# Patient Record
Sex: Female | Born: 1980
Health system: Southern US, Community
[De-identification: ages and names within clinical notes are randomized; demographics above are authoritative.]

## PROBLEM LIST (undated history)

## (undated) DIAGNOSIS — O24419 Gestational diabetes mellitus in pregnancy, unspecified control: Secondary | ICD-10-CM

## (undated) DIAGNOSIS — O1490 Unspecified pre-eclampsia, unspecified trimester: Secondary | ICD-10-CM

## (undated) DIAGNOSIS — N189 Chronic kidney disease, unspecified: Secondary | ICD-10-CM

## (undated) DIAGNOSIS — Z9889 Other specified postprocedural states: Secondary | ICD-10-CM

## (undated) DIAGNOSIS — N390 Urinary tract infection, site not specified: Secondary | ICD-10-CM

## (undated) DIAGNOSIS — F99 Mental disorder, not otherwise specified: Secondary | ICD-10-CM

## (undated) DIAGNOSIS — K219 Gastro-esophageal reflux disease without esophagitis: Secondary | ICD-10-CM

## (undated) DIAGNOSIS — R112 Nausea with vomiting, unspecified: Secondary | ICD-10-CM

## (undated) DIAGNOSIS — Z87442 Personal history of urinary calculi: Secondary | ICD-10-CM

## (undated) HISTORY — DX: Mental disorder, not otherwise specified: F99

## (undated) HISTORY — DX: Chronic kidney disease, unspecified: N18.9

## (undated) HISTORY — DX: Unspecified pre-eclampsia, unspecified trimester: O14.90

## (undated) HISTORY — DX: Gestational diabetes mellitus in pregnancy, unspecified control: O24.419

---

## 2001-10-28 ENCOUNTER — Emergency Department (HOSPITAL_COMMUNITY): Admission: EM | Admit: 2001-10-28 | Discharge: 2001-10-28 | Payer: Self-pay | Admitting: Emergency Medicine

## 2012-03-29 ENCOUNTER — Emergency Department (HOSPITAL_COMMUNITY): Payer: Self-pay

## 2012-03-29 ENCOUNTER — Emergency Department (HOSPITAL_COMMUNITY)
Admission: EM | Admit: 2012-03-29 | Discharge: 2012-03-29 | Disposition: A | Discharge status: Home / Self Care | Payer: Self-pay | Attending: Emergency Medicine | Admitting: Emergency Medicine

## 2012-03-29 ENCOUNTER — Encounter (HOSPITAL_COMMUNITY): Payer: Self-pay

## 2012-03-29 DIAGNOSIS — N390 Urinary tract infection, site not specified: Secondary | ICD-10-CM | POA: Insufficient documentation

## 2012-03-29 DIAGNOSIS — R10819 Abdominal tenderness, unspecified site: Secondary | ICD-10-CM | POA: Insufficient documentation

## 2012-03-29 DIAGNOSIS — R109 Unspecified abdominal pain: Secondary | ICD-10-CM | POA: Insufficient documentation

## 2012-03-29 HISTORY — DX: Urinary tract infection, site not specified: N39.0

## 2012-03-29 LAB — COMPREHENSIVE METABOLIC PANEL
AST: 17 U/L (ref 0–37)
Albumin: 2.9 g/dL — ABNORMAL LOW (ref 3.5–5.2)
BUN: 10 mg/dL (ref 6–23)
Chloride: 99 mEq/L (ref 96–112)
Creatinine, Ser: 0.77 mg/dL (ref 0.50–1.10)
Potassium: 4.3 mEq/L (ref 3.5–5.1)
Total Protein: 6.7 g/dL (ref 6.0–8.3)

## 2012-03-29 LAB — CBC WITH DIFFERENTIAL/PLATELET
Basophils Absolute: 0 10*3/uL (ref 0.0–0.1)
Basophils Relative: 0 % (ref 0–1)
Eosinophils Absolute: 0.4 10*3/uL (ref 0.0–0.7)
Hemoglobin: 14.5 g/dL (ref 12.0–15.0)
MCH: 30.3 pg (ref 26.0–34.0)
MCHC: 34.4 g/dL (ref 30.0–36.0)
Monocytes Absolute: 1.2 10*3/uL — ABNORMAL HIGH (ref 0.1–1.0)
Monocytes Relative: 5 % (ref 3–12)
Neutro Abs: 16.5 10*3/uL — ABNORMAL HIGH (ref 1.7–7.7)
Neutrophils Relative %: 75 % (ref 43–77)
RDW: 13 % (ref 11.5–15.5)

## 2012-03-29 LAB — URINALYSIS, ROUTINE W REFLEX MICROSCOPIC
Glucose, UA: NEGATIVE mg/dL
Leukocytes, UA: NEGATIVE
pH: 7 (ref 5.0–8.0)

## 2012-03-29 LAB — PREGNANCY, URINE: Preg Test, Ur: NEGATIVE

## 2012-03-29 LAB — URINE MICROSCOPIC-ADD ON

## 2012-03-29 MED ORDER — CEPHALEXIN 500 MG PO CAPS: 500.0000 mg | ORAL_CAPSULE | Freq: Four times a day (QID) | ORAL | Status: DC

## 2012-03-29 MED ORDER — HYDROMORPHONE HCL PF 1 MG/ML IJ SOLN
1.0000 mg | Freq: Once | INTRAMUSCULAR | Status: AC
  Administered 2012-03-29: 1 mg via INTRAVENOUS
  Filled 2012-03-29: qty 1

## 2012-03-29 MED ORDER — ONDANSETRON HCL 4 MG PO TABS: 4.0000 mg | ORAL_TABLET | Freq: Four times a day (QID) | ORAL | Status: DC

## 2012-03-29 MED ORDER — ONDANSETRON HCL 4 MG/2ML IJ SOLN
4.0000 mg | Freq: Once | INTRAMUSCULAR | Status: AC
  Administered 2012-03-29: 4 mg via INTRAVENOUS
  Filled 2012-03-29: qty 2

## 2012-03-29 MED ORDER — DEXTROSE 5 % IV SOLN
1.0000 g | Freq: Once | INTRAVENOUS | Status: AC
  Administered 2012-03-29: 1 g via INTRAVENOUS
  Filled 2012-03-29: qty 10

## 2012-03-29 MED ORDER — HYDROCODONE-ACETAMINOPHEN 5-325 MG PO TABS: 1.0000 | ORAL_TABLET | Freq: Four times a day (QID) | ORAL | Status: DC | PRN

## 2012-03-29 NOTE — ED Notes (Signed)
Pt reports left flank pain for 1 week, was seen at urgent care this am and has results, told to come to er for ct scan, +n/v, +fever. Pain is constant, started in lower back and is now into left lower quad area.

## 2012-03-29 NOTE — ED Notes (Signed)
Gave pt's husband her prescriptions so he could go ahead and get them filled.

## 2012-03-29 NOTE — ED Provider Notes (Signed)
History  This chart was scribed for Sherry Lennert, MD by Erskine Emery. This patient was seen in room APA14/APA14 and the patient's care was started at 10:49.   CSN: [id number]  Arrival date & time 03/29/12  1007   First MD Initiated Contact with Patient 03/29/12 1049      Chief Complaint  Patient presents with  . Flank Pain    (Consider location/radiation/quality/duration/timing/severity/associated sxs/prior Treatment) Sherry Flores is a 31 y.o. female who presents to the Emergency Department complaining of right flank pain, abdominal pain, nausea, and cold sweats for the past week. Pt reports a h/o 2 caesarian sections. Pt was seen last week for a similar complaint, located more in the back, and was treated for muscular issues with no urinalysis. Pt went to urgent care this morning; they did a urinalysis but did not give her a pregnancy test then sent her here to have an abdominal CT.  Patient is a 31 y.o. female presenting with flank pain. The history is provided by the patient. No language interpreter was used.  Flank Pain This is a new problem. The current episode started more than 2 days ago. The problem occurs constantly. The problem has not changed since onset.Associated symptoms include abdominal pain. Pertinent negatives include no chest pain and no headaches. Nothing aggravates the symptoms.    Past Medical History  Diagnosis Date  . UTI (lower urinary tract infection)     Past Surgical History  Procedure Date  . Cesarean section     No family history on file.  History  Substance Use Topics  . Smoking status: Never Smoker   . Smokeless tobacco: Not on file  . Alcohol Use: No    OB History    Grav Para Term Preterm Abortions TAB SAB Ect Mult Living                  Review of Systems  Constitutional: Positive for diaphoresis. Negative for fatigue.  HENT: Negative for congestion, sinus pressure and ear discharge.   Eyes: Negative for discharge.    Respiratory: Negative for cough.   Cardiovascular: Negative for chest pain.  Gastrointestinal: Positive for nausea and abdominal pain. Negative for diarrhea.  Genitourinary: Positive for flank pain. Negative for frequency and hematuria.  Musculoskeletal: Positive for back pain.  Skin: Negative for rash.  Neurological: Negative for seizures and headaches.  Hematological: Negative.   Psychiatric/Behavioral: Negative for hallucinations.  All other systems reviewed and are negative.    Allergies  Review of patient's allergies indicates no known allergies.  Home Medications  No current outpatient prescriptions on file.  Triage Vitals: BP 130/88  Pulse 88  Temp 99.1 F (37.3 C) (Oral)  Resp 20  Ht 5\' 4"  (1.626 m)  Wt 175 lb (79.379 kg)  BMI 30.04 kg/m2  SpO2 100%  LMP 03/08/2012  Physical Exam  Nursing note and vitals reviewed. Constitutional: She is oriented to person, place, and time. She appears well-developed.  HENT:  Head: Normocephalic and atraumatic.  Eyes: Conjunctivae normal and EOM are normal. No scleral icterus.  Neck: Neck supple. No thyromegaly present.  Cardiovascular: Normal rate and regular rhythm.  Exam reveals no gallop and no friction rub.   No murmur heard. Pulmonary/Chest: No stridor. She has no wheezes. She has no rales. She exhibits no tenderness.  Abdominal: Soft. She exhibits no distension. There is no tenderness. There is no rebound.       Mild right flank and RUQ tenderness.  Musculoskeletal: Normal  range of motion. She exhibits no edema.  Lymphadenopathy:    She has no cervical adenopathy.  Neurological: She is oriented to person, place, and time. Coordination normal.  Skin: No rash noted. No erythema.  Psychiatric: She has a normal mood and affect. Her behavior is normal.    ED Course  Procedures (including critical care time) DIAGNOSTIC STUDIES: Oxygen Saturation is 100% on room air, normal by my interpretation.    COORDINATION OF  CARE: 10:58--I evaluated the patient and we discussed a treatment plan including abdominal CT to which the pt agreed.    Labs Reviewed - No data to display No results found.   No diagnosis found.    MDM        The chart was scribed for me under my direct supervision.  I personally performed the history, physical, and medical decision making and all procedures in the evaluation of this patient.Sherry Lennert, MD 03/29/12 [phone]

## 2012-04-01 LAB — URINE CULTURE

## 2012-04-02 NOTE — ED Notes (Signed)
+   urine Patient treated with Keflex-sensitive to same-chart appended per protocol MD. 

## 2013-04-30 IMAGING — CT CT ABD-PELV W/O CM
2 of 3 series · 9 of 46 positions shown, 11 images · non-contrast
Comparison: None.

CLINICAL DATA: Right flank pain.

CT ABDOMEN AND PELVIS WITHOUT CONTRAST
TECHNIQUE: Multidetector CT imaging of the abdomen and pelvis was
performed following the standard protocol without intravenous
contrast.

[Series 4: mpr coronal (id) · coronal · 0.83mm/px · 8 of 70 slices shown, 9 images]
[im 8/70  soft-tissue]
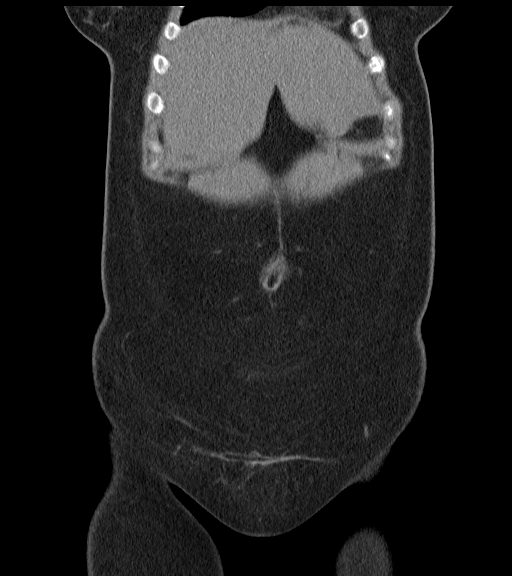
[im 8/70  bone]
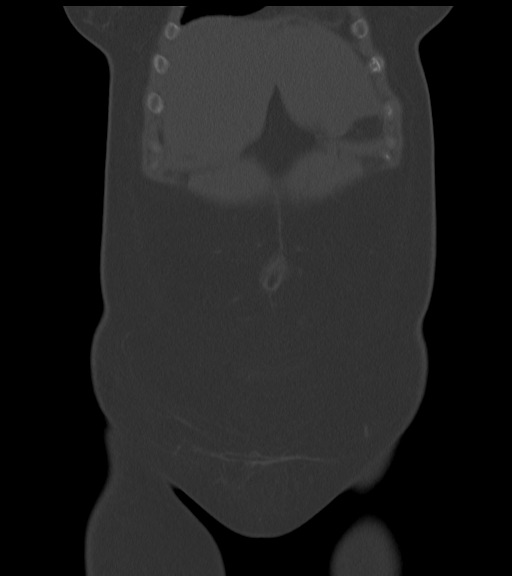
[im 16/70  soft-tissue]
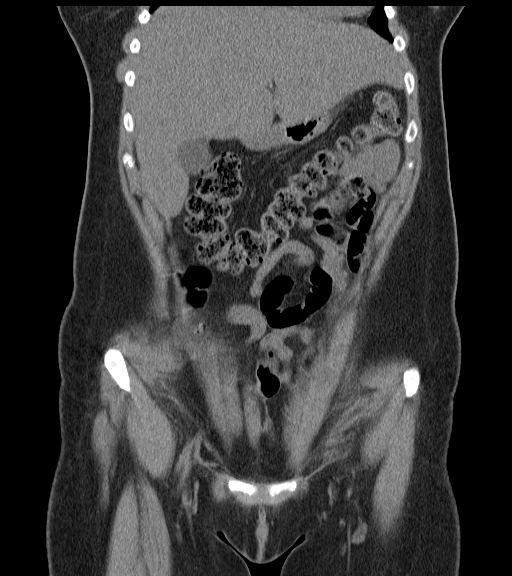
[im 24/70  soft-tissue]
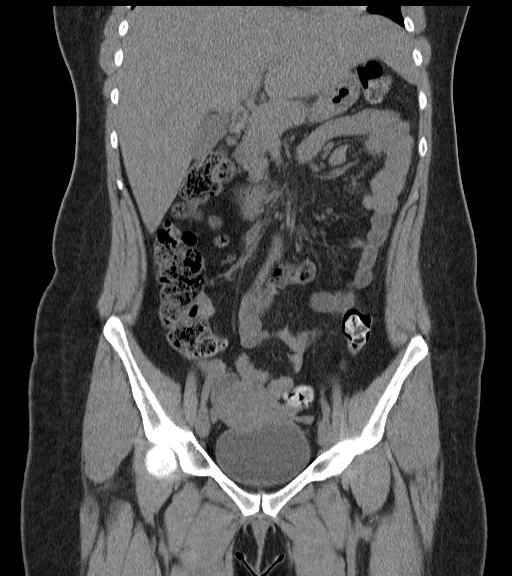
[im 31/70  soft-tissue]
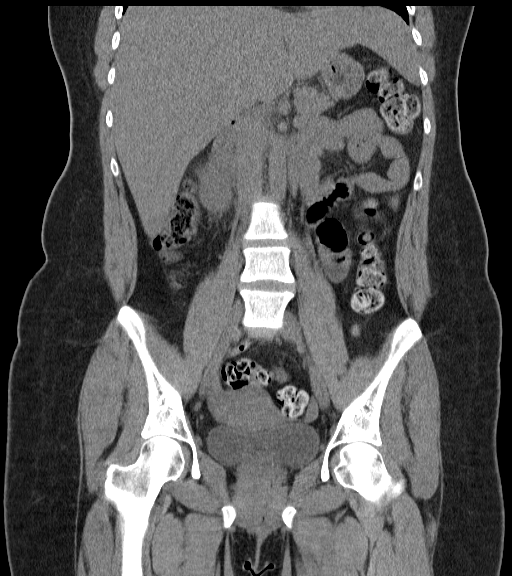
[im 39/70  soft-tissue]
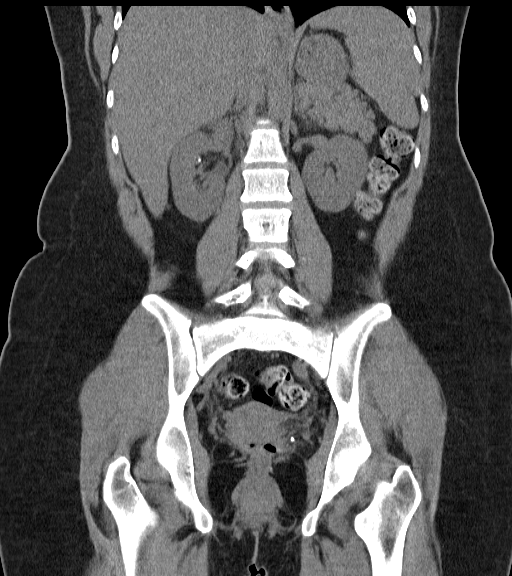
[im 47/70  soft-tissue]
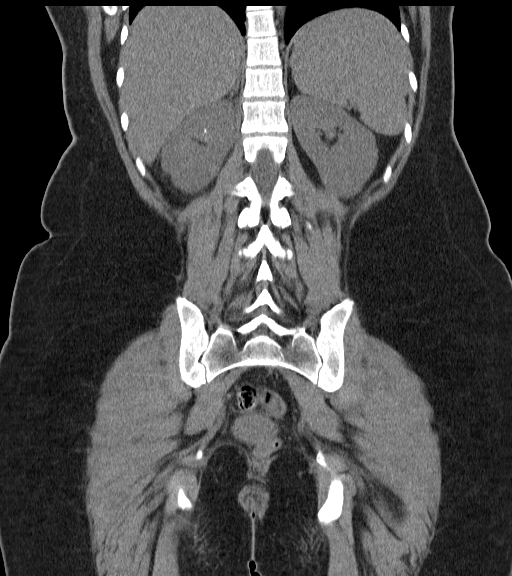
[im 54/70  soft-tissue]
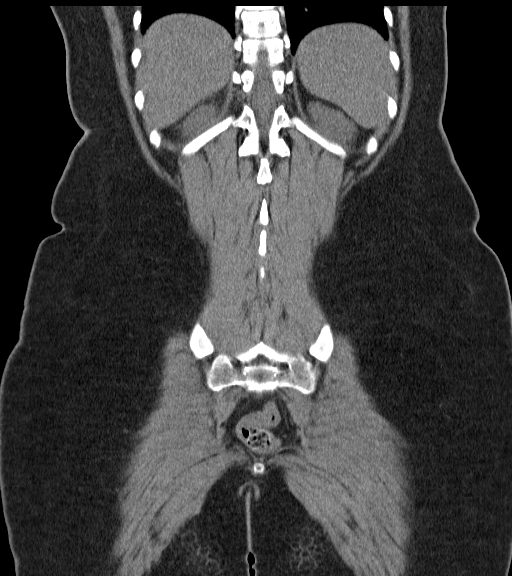
[im 62/70  soft-tissue]
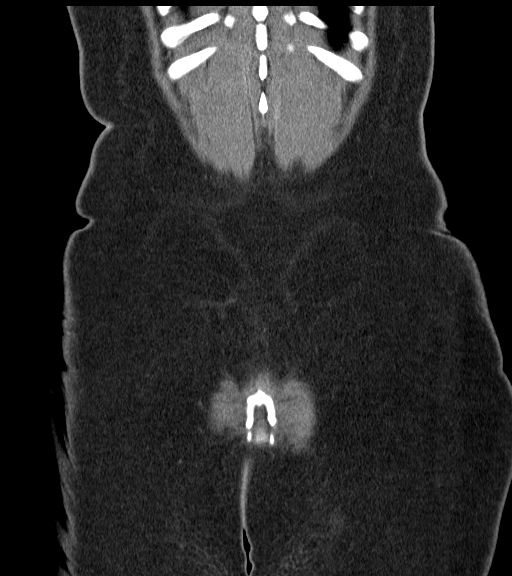

[Series 5: mpr sagittal (id) · sagittal · 0.63mm/px · 1 of 107 slices shown, 2 images]
[im 36/107  soft-tissue]
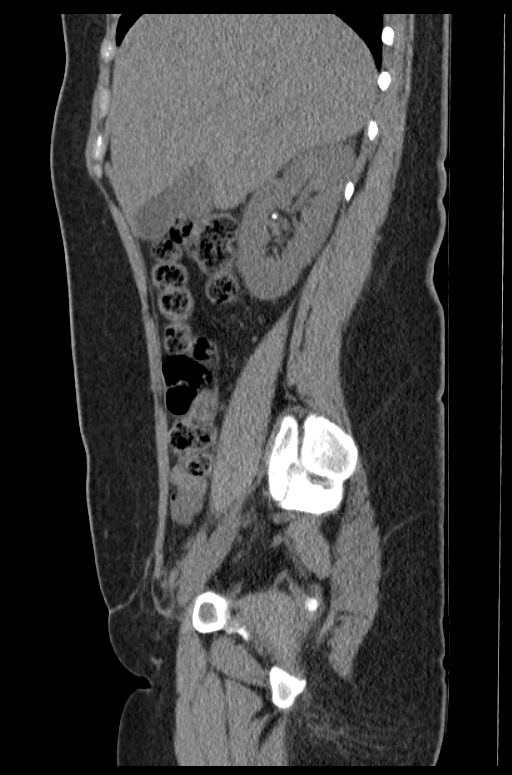
[im 36/107  bone]
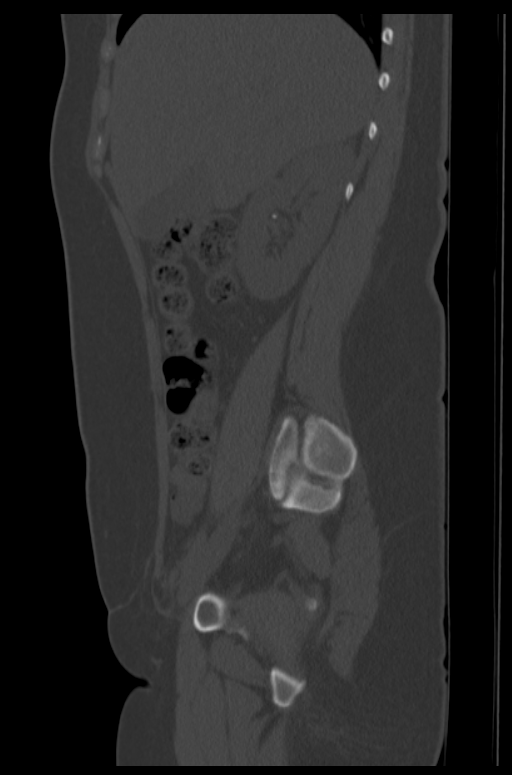

[9 of 46 positions shown; findings below may reference images not displayed]

FINDINGS: The visualized portion of the liver, spleen, pancreas,
and adrenal glands appear unremarkable in noncontrast CT
appearance.

The gallbladder and biliary system appear unremarkable.

Approximately for left and 10-15 right tiny nonobstructive renal
calculi are observed, measuring up to 2 mm in diameter.  No
hydronephrosis or hydroureter.  No discrete ureteral calculus or
bladder calculus observed.  There are several small pelvic vascular
phleboliths.

No pathologic adenopathy observed.  There is evidence of a prior
low transverse incision related to remote cesarean section.
Appendix within normal limits.

Urinary bladder unremarkable.  No free pelvic fluid or adnexal
mass. Prominence of stool throughout the colon suggests
constipation.  Probable disc bulges at L4-5 and L5-S1.
IMPRESSION: 1.  Multiple tiny bilateral nonobstructive renal calculi noted.
There is mildly asymmetric right perirenal stranding but without a
currently obstructive lesion observed.
2.  Probable disc bulges at L4-5 and L5-S1.
3. Prominence of stool throughout the colon suggests constipation.

## 2015-07-11 DIAGNOSIS — Z6833 Body mass index (BMI) 33.0-33.9, adult: Secondary | ICD-10-CM | POA: Diagnosis not present

## 2015-07-11 DIAGNOSIS — N926 Irregular menstruation, unspecified: Secondary | ICD-10-CM | POA: Diagnosis not present

## 2015-07-11 DIAGNOSIS — N939 Abnormal uterine and vaginal bleeding, unspecified: Secondary | ICD-10-CM | POA: Diagnosis not present

## 2015-07-11 DIAGNOSIS — Z01419 Encounter for gynecological examination (general) (routine) without abnormal findings: Secondary | ICD-10-CM | POA: Diagnosis not present

## 2015-07-11 DIAGNOSIS — E8881 Metabolic syndrome: Secondary | ICD-10-CM | POA: Diagnosis not present

## 2015-08-17 DIAGNOSIS — N979 Female infertility, unspecified: Secondary | ICD-10-CM | POA: Diagnosis not present

## 2015-09-08 DIAGNOSIS — N979 Female infertility, unspecified: Secondary | ICD-10-CM | POA: Diagnosis not present

## 2015-09-26 DIAGNOSIS — E288 Other ovarian dysfunction: Secondary | ICD-10-CM | POA: Diagnosis not present

## 2015-09-26 DIAGNOSIS — Z3161 Procreative counseling and advice using natural family planning: Secondary | ICD-10-CM | POA: Diagnosis not present

## 2015-10-07 DIAGNOSIS — E288 Other ovarian dysfunction: Secondary | ICD-10-CM | POA: Diagnosis not present

## 2015-10-25 DIAGNOSIS — E288 Other ovarian dysfunction: Secondary | ICD-10-CM | POA: Diagnosis not present

## 2015-11-22 DIAGNOSIS — Z319 Encounter for procreative management, unspecified: Secondary | ICD-10-CM | POA: Diagnosis not present

## 2016-04-30 ENCOUNTER — Encounter: Payer: Self-pay | Admitting: Adult Health

## 2016-04-30 ENCOUNTER — Ambulatory Visit (INDEPENDENT_AMBULATORY_CARE_PROVIDER_SITE_OTHER): Payer: 59 | Admitting: Adult Health

## 2016-04-30 VITALS — BP 140/78 | HR 66 | Ht 64.0 in | Wt 207.5 lb

## 2016-04-30 DIAGNOSIS — N926 Irregular menstruation, unspecified: Secondary | ICD-10-CM

## 2016-04-30 DIAGNOSIS — Z3490 Encounter for supervision of normal pregnancy, unspecified, unspecified trimester: Secondary | ICD-10-CM

## 2016-04-30 DIAGNOSIS — R110 Nausea: Secondary | ICD-10-CM

## 2016-04-30 DIAGNOSIS — Z3201 Encounter for pregnancy test, result positive: Secondary | ICD-10-CM | POA: Diagnosis not present

## 2016-04-30 DIAGNOSIS — K5909 Other constipation: Secondary | ICD-10-CM

## 2016-04-30 DIAGNOSIS — O3680X0 Pregnancy with inconclusive fetal viability, not applicable or unspecified: Secondary | ICD-10-CM

## 2016-04-30 LAB — POCT URINE PREGNANCY: PREG TEST UR: POSITIVE — AB

## 2016-04-30 MED ORDER — CITRANATAL HARMONY 27-1-260 MG PO CAPS: ORAL_CAPSULE | ORAL | 11 refills | Status: DC

## 2016-04-30 NOTE — Patient Instructions (Signed)
First Trimester of Pregnancy The first trimester of pregnancy is from week 1 until the end of week 12 (months 1 through 3). A week after a sperm fertilizes an egg, the egg will implant on the wall of the uterus. This embryo will begin to develop into a baby. Genes from you and your partner are forming the baby. The female genes determine whether the baby is a boy or a girl. At 6-8 weeks, the eyes and face are formed, and the heartbeat can be seen on ultrasound. At the end of 12 weeks, all the baby's organs are formed.  Now that you are pregnant, you will want to do everything you can to have a healthy baby. Two of the most important things are to get good prenatal care and to follow your health care provider's instructions. Prenatal care is all the medical care you receive before the baby's birth. This care will help prevent, find, and treat any problems during the pregnancy and childbirth. BODY CHANGES Your body goes through many changes during pregnancy. The changes vary from woman to woman.   You may gain or lose a couple of pounds at first.  You may feel sick to your stomach (nauseous) and throw up (vomit). If the vomiting is uncontrollable, call your health care provider.  You may tire easily.  You may develop headaches that can be relieved by medicines approved by your health care provider.  You may urinate more often. Painful urination may mean you have a bladder infection.  You may develop heartburn as a result of your pregnancy.  You may develop constipation because certain hormones are causing the muscles that push waste through your intestines to slow down.  You may develop hemorrhoids or swollen, bulging veins (varicose veins).  Your breasts may begin to grow larger and become tender. Your nipples may stick out more, and the tissue that surrounds them (areola) may become darker.  Your gums may bleed and may be sensitive to brushing and flossing.  Dark spots or blotches (chloasma,  mask of pregnancy) may develop on your face. This will likely fade after the baby is born.  Your menstrual periods will stop.  You may have a loss of appetite.  You may develop cravings for certain kinds of food.  You may have changes in your emotions from day to day, such as being excited to be pregnant or being concerned that something may go wrong with the pregnancy and baby.  You may have more vivid and strange dreams.  You may have changes in your hair. These can include thickening of your hair, rapid growth, and changes in texture. Some women also have hair loss during or after pregnancy, or hair that feels dry or thin. Your hair will most likely return to normal after your baby is born. WHAT TO EXPECT AT YOUR PRENATAL VISITS During a routine prenatal visit:  You will be weighed to make sure you and the baby are growing normally.  Your blood pressure will be taken.  Your abdomen will be measured to track your baby's growth.  The fetal heartbeat will be listened to starting around week 10 or 12 of your pregnancy.  Test results from any previous visits will be discussed. Your health care provider may ask you:  How you are feeling.  If you are feeling the baby move.  If you have had any abnormal symptoms, such as leaking fluid, bleeding, severe headaches, or abdominal cramping.  If you are using any tobacco products,   including cigarettes, chewing tobacco, and electronic cigarettes.  If you have any questions. Other tests that may be performed during your first trimester include:  Blood tests to find your blood type and to check for the presence of any previous infections. They will also be used to check for low iron levels (anemia) and Rh antibodies. Later in the pregnancy, blood tests for diabetes will be done along with other tests if problems develop.  Urine tests to check for infections, diabetes, or protein in the urine.  An ultrasound to confirm the proper growth  and development of the baby.  An amniocentesis to check for possible genetic problems.  Fetal screens for spina bifida and Down syndrome.  You may need other tests to make sure you and the baby are doing well.  HIV (human immunodeficiency virus) testing. Routine prenatal testing includes screening for HIV, unless you choose not to have this test. HOME CARE INSTRUCTIONS  Medicines  Follow your health care provider's instructions regarding medicine use. Specific medicines may be either safe or unsafe to take during pregnancy.  Take your prenatal vitamins as directed.  If you develop constipation, try taking a stool softener if your health care provider approves. Diet  Eat regular, well-balanced meals. Choose a variety of foods, such as meat or vegetable-based protein, fish, milk and low-fat dairy products, vegetables, fruits, and whole grain breads and cereals. Your health care provider will help you determine the amount of weight gain that is right for you.  Avoid raw meat and uncooked cheese. These carry germs that can cause birth defects in the baby.  Eating four or five small meals rather than three large meals a day may help relieve nausea and vomiting. If you start to feel nauseous, eating a few soda crackers can be helpful. Drinking liquids between meals instead of during meals also seems to help nausea and vomiting.  If you develop constipation, eat more high-fiber foods, such as fresh vegetables or fruit and whole grains. Drink enough fluids to keep your urine clear or pale yellow. Activity and Exercise  Exercise only as directed by your health care provider. Exercising will help you:  Control your weight.  Stay in shape.  Be prepared for labor and delivery.  Experiencing pain or cramping in the lower abdomen or low back is a good sign that you should stop exercising. Check with your health care provider before continuing normal exercises.  Try to avoid standing for long  periods of time. Move your legs often if you must stand in one place for a long time.  Avoid heavy lifting.  Wear low-heeled shoes, and practice good posture.  You may continue to have sex unless your health care provider directs you otherwise. Relief of Pain or Discomfort  Wear a good support bra for breast tenderness.   Take warm sitz baths to soothe any pain or discomfort caused by hemorrhoids. Use hemorrhoid cream if your health care provider approves.   Rest with your legs elevated if you have leg cramps or low back pain.  If you develop varicose veins in your legs, wear support hose. Elevate your feet for 15 minutes, 3-4 times a day. Limit salt in your diet. Prenatal Care  Schedule your prenatal visits by the twelfth week of pregnancy. They are usually scheduled monthly at first, then more often in the last 2 months before delivery.  Write down your questions. Take them to your prenatal visits.  Keep all your prenatal visits as directed by your   health care provider. Safety  Wear your seat belt at all times when driving.  Make a list of emergency phone numbers, including numbers for family, friends, the hospital, and police and fire departments. General Tips  Ask your health care provider for a referral to a local prenatal education class. Begin classes no later than at the beginning of month 6 of your pregnancy.  Ask for help if you have counseling or nutritional needs during pregnancy. Your health care provider can offer advice or refer you to specialists for help with various needs.  Do not use hot tubs, steam rooms, or saunas.  Do not douche or use tampons or scented sanitary pads.  Do not cross your legs for long periods of time.  Avoid cat litter boxes and soil used by cats. These carry germs that can cause birth defects in the baby and possibly loss of the fetus by miscarriage or stillbirth.  Avoid all smoking, herbs, alcohol, and medicines not prescribed by  your health care provider. Chemicals in these affect the formation and growth of the baby.  Do not use any tobacco products, including cigarettes, chewing tobacco, and electronic cigarettes. If you need help quitting, ask your health care provider. You may receive counseling support and other resources to help you quit.  Schedule a dentist appointment. At home, brush your teeth with a soft toothbrush and be gentle when you floss. SEEK MEDICAL CARE IF:   You have dizziness.  You have mild pelvic cramps, pelvic pressure, or nagging pain in the abdominal area.  You have persistent nausea, vomiting, or diarrhea.  You have a bad smelling vaginal discharge.  You have pain with urination.  You notice increased swelling in your face, hands, legs, or ankles. SEEK IMMEDIATE MEDICAL CARE IF:   You have a fever.  You are leaking fluid from your vagina.  You have spotting or bleeding from your vagina.  You have severe abdominal cramping or pain.  You have rapid weight gain or loss.  You vomit blood or material that looks like coffee grounds.  You are exposed to German measles and have never had them.  You are exposed to fifth disease or chickenpox.  You develop a severe headache.  You have shortness of breath.  You have any kind of trauma, such as from a fall or a car accident.   This information is not intended to replace advice given to you by your health care provider. Make sure you discuss any questions you have with your health care provider.   Document Released: 06/05/2001 Document Revised: 07/02/2014 Document Reviewed: 04/21/2013 Elsevier Interactive Patient Education 2016 [employer]  

## 2016-04-30 NOTE — Progress Notes (Signed)
Subjective:     Patient ID: Sherry Flores, female   DOB: 05-31-1981, 35 y.o.   MRN: [id number]  HPI Sherry Flores is a 35 year old white female in for UPT, has missed a period, and has had 4+HPT.She has some nausea and constipation.She has 2 boys by C section and is interested in VBAC,She had gestational diabetes and had pre eclampsia with first son. [facility]. She had taking shots and meds for fertility and then stopped and got pregnant.  Review of Systems +missed period + nausea +constipation Reviewed past medical,surgical, social and family history. Reviewed medications and allergies.     Objective:   Physical Exam BP 140/78 (BP Location: Left Arm, Patient Position: Sitting, Cuff Size: Large)   Pulse 66   Ht 5\' 4"  (1.626 m)   Wt 207 lb 8 oz (94.1 kg)   LMP 02/27/2016 (Exact Date)   BMI 35.62 kg/m    UPT +. About 9 weeks by LMP with EDD 12/03/16, Skin warm and dry. Neck: mid line trachea, normal thyroid, good ROM, no lymphadenopathy noted. Lungs: clear to ausculation bilaterally. Cardiovascular: regular rate and rhythm.Abdomen is soft and non tender. PHQ 2 score 0.  Assessment:     1. Pregnancy examination or test, positive result   2. Pregnancy, unspecified gestational age   793. Pregnancy with inconclusive fetal viability, not applicable or unspecified fetus       Plan:     Rx citranatal assure, #60 take daily with 11 refills Return in 1 week for dating US Review handout on first trimester Eat often

## 2016-05-07 ENCOUNTER — Ambulatory Visit (INDEPENDENT_AMBULATORY_CARE_PROVIDER_SITE_OTHER): Payer: 59

## 2016-05-07 DIAGNOSIS — O3680X0 Pregnancy with inconclusive fetal viability, not applicable or unspecified: Secondary | ICD-10-CM

## 2016-05-07 DIAGNOSIS — Z3A08 8 weeks gestation of pregnancy: Secondary | ICD-10-CM

## 2016-05-07 NOTE — Progress Notes (Signed)
US 7+3 wks,single IUP w/ys,pos fht 145 bpm,normal ov's bilat,crl 12.4 mm

## 2016-05-24 ENCOUNTER — Encounter: Payer: Self-pay | Admitting: Women's Health

## 2016-05-24 ENCOUNTER — Ambulatory Visit (INDEPENDENT_AMBULATORY_CARE_PROVIDER_SITE_OTHER): Payer: 59 | Admitting: Women's Health

## 2016-05-24 VITALS — BP 122/62 | HR 74 | Wt 213.0 lb

## 2016-05-24 DIAGNOSIS — Z331 Pregnant state, incidental: Secondary | ICD-10-CM | POA: Diagnosis not present

## 2016-05-24 DIAGNOSIS — Z3682 Encounter for antenatal screening for nuchal translucency: Secondary | ICD-10-CM

## 2016-05-24 DIAGNOSIS — Z3481 Encounter for supervision of other normal pregnancy, first trimester: Secondary | ICD-10-CM

## 2016-05-24 DIAGNOSIS — Z1389 Encounter for screening for other disorder: Secondary | ICD-10-CM | POA: Diagnosis not present

## 2016-05-24 DIAGNOSIS — Z98891 History of uterine scar from previous surgery: Secondary | ICD-10-CM | POA: Insufficient documentation

## 2016-05-24 DIAGNOSIS — Z23 Encounter for immunization: Secondary | ICD-10-CM

## 2016-05-24 DIAGNOSIS — O0990 Supervision of high risk pregnancy, unspecified, unspecified trimester: Secondary | ICD-10-CM | POA: Insufficient documentation

## 2016-05-24 DIAGNOSIS — Z3A10 10 weeks gestation of pregnancy: Secondary | ICD-10-CM

## 2016-05-24 DIAGNOSIS — O210 Mild hyperemesis gravidarum: Secondary | ICD-10-CM

## 2016-05-24 DIAGNOSIS — O09299 Supervision of pregnancy with other poor reproductive or obstetric history, unspecified trimester: Secondary | ICD-10-CM | POA: Insufficient documentation

## 2016-05-24 DIAGNOSIS — Z8632 Personal history of gestational diabetes: Secondary | ICD-10-CM

## 2016-05-24 LAB — POCT URINALYSIS DIPSTICK
Blood, UA: NEGATIVE
Glucose, UA: NEGATIVE
Ketones, UA: NEGATIVE
LEUKOCYTES UA: NEGATIVE
Nitrite, UA: NEGATIVE
PROTEIN UA: NEGATIVE

## 2016-05-24 NOTE — Progress Notes (Signed)
  Subjective:  Sherry CovertJessica L Flores is a 35 y.o. [id number] Caucasian female at 1735w6d by 7wk u/s, being seen today for her first obstetrical visit.  Her obstetrical history is significant for h/o c/s x 2- 1st ftp @ 6cm w/ IOL for pre-e, 2nd repeat, interested in TOLAC, h/o pre-e 1st pregnancy, h/o A1DM 2nd pregnancy.  Pregnancy history fully reviewed.  Patient reports some mild nausea- declines meds. Denies vb, cramping, uti s/s, abnormal/malodorous vag d/c, or vulvovaginal itching/irritation.  BP 122/62   Pulse 74   Wt 213 lb (96.6 kg)   LMP 02/27/2016 (Exact Date)   BMI 36.56 kg/m   HISTORY: OB History  Gravida Para Term Preterm AB Living  3 2 2     2   SAB TAB Ectopic Multiple Live Births          2    # Outcome Date GA Lbr Len/2nd Weight Sex Delivery Anes PTL Lv  3 Current           2 Term 11/28/10 6863w0d  7 lb 7 oz (3.374 kg) M CS-LTranv   LIV  1 Term 07/15/03 3758w0d  6 lb 4 oz (2.835 kg) M CS-LTranv   LIV     Past Medical History:  Diagnosis Date  . Gestational diabetes   . Pre-eclampsia   . UTI (lower urinary tract infection)    Past Surgical History:  Procedure Laterality Date  . CESAREAN SECTION     Family History  Problem Relation Age of Onset  . Aneurysm Paternal Grandfather     brain  . Dementia Maternal Grandfather   . Diabetes Mother     Exam   System:     General: Well developed & nourished, no acute distress   Skin: Warm & dry, normal coloration and turgor, no rashes   Neurologic: Alert & oriented, normal mood   Cardiovascular: Regular rate & rhythm   Respiratory: Effort & rate normal, LCTAB, acyanotic   Abdomen: Soft, non tender   Extremities: normal strength, tone   Pelvic Exam:    Perineum: Normal perineum   Vulva: Normal, no lesions   Vagina:  Normal mucosa, normal discharge   Cervix: Normal, bulbous, appears closed   Uterus: Normal size/shape/contour for GA   [facility]: unable to hear w/ doppler, will get in w/  amber to check   Assessment:   Pregnancy: [id number] Patient Active Problem List   Diagnosis Date Noted  . History of 2 cesarean sections 05/24/2016  . Encounter for supervision of other normal pregnancy, first trimester 05/24/2016  . History of pre-eclampsia in prior pregnancy, currently pregnant in first trimester   . History of gestational diabetes in prior pregnancy, currently pregnant in first trimester     5735w6d [id number] New OB visit H/O pre-e  H/O GDM Prev c/s x 2  Plan:  Initial labs drawn Continue prenatal vitamins Problem list reviewed and updated Reviewed n/v relief measures and warning s/s to report Reviewed recommended weight gain based on pre-gravid BMI Encouraged well-balanced diet Genetic Screening discussed Integrated Screen: requested Cystic fibrosis screening discussed declined Ultrasound discussed; fetal survey: requested Follow up in 2 weeks for 1st it/nt, visit, and early 2hr gtt [facility], gave consent to take home and review Baby ASA daily @ 12wks CCNC completed  Marge DuncansBooker, Janeshia Ciliberto Randall CNM, [facility] 05/24/2016 2:30 PM

## 2016-05-24 NOTE — Patient Instructions (Addendum)
You will have your sugar test next visit.  Please do not eat or drink anything after midnight the night before you come, not even water.  You will be here for at least two hours.     Begin taking a 81mg  baby aspirin daily at 12 weeks of pregnancy (12/15) to decrease risk of preeclampsia during pregnancy   Nausea & Vomiting  Have saltine crackers or pretzels by your bed and eat a few bites before you raise your head out of bed in the morning  Eat small frequent meals throughout the day instead of large meals  Drink plenty of fluids throughout the day to stay hydrated, just don't drink a lot of fluids with your meals.  This can make your stomach fill up faster making you feel sick  Do not brush your teeth right after you eat  Products with real ginger are good for nausea, like ginger ale and ginger hard candy Make sure it says made with real ginger!  Sucking on sour candy like lemon heads is also good for nausea  If your prenatal vitamins make you nauseated, take them at night so you will sleep through the nausea  Sea Bands  If you feel like you need medicine for the nausea & vomiting please let us know  If you are unable to keep any fluids or food down please let us know

## 2016-05-25 LAB — URINE CULTURE

## 2016-05-26 LAB — URINALYSIS, ROUTINE W REFLEX MICROSCOPIC
Bilirubin, UA: NEGATIVE
GLUCOSE, UA: NEGATIVE
KETONES UA: NEGATIVE
Leukocytes, UA: NEGATIVE
NITRITE UA: NEGATIVE
Protein, UA: NEGATIVE
RBC, UA: NEGATIVE
SPEC GRAV UA: 1.022 (ref 1.005–1.030)
UUROB: 0.2 mg/dL (ref 0.2–1.0)
pH, UA: 6.5 (ref 5.0–7.5)

## 2016-05-26 LAB — CBC
HEMATOCRIT: 38.8 % (ref 34.0–46.6)
HEMOGLOBIN: 12.9 g/dL (ref 11.1–15.9)
MCH: 29.2 pg (ref 26.6–33.0)
MCHC: 33.2 g/dL (ref 31.5–35.7)
MCV: 88 fL (ref 79–97)
Platelets: 320 10*3/uL (ref 150–379)
RBC: 4.42 x10E6/uL (ref 3.77–5.28)
RDW: 13.6 % (ref 12.3–15.4)
WBC: 13.4 10*3/uL — ABNORMAL HIGH (ref 3.4–10.8)

## 2016-05-26 LAB — PMP SCREEN PROFILE (10S), URINE
Amphetamine Screen, Ur: NEGATIVE ng/mL
BARBITURATE SCRN UR: NEGATIVE ng/mL
BENZODIAZEPINE SCREEN, URINE: NEGATIVE ng/mL
CREATININE(CRT), U: 115.3 mg/dL (ref 20.0–300.0)
Cannabinoids Ur Ql Scn: NEGATIVE ng/mL
Cocaine(Metab.)Screen, Urine: NEGATIVE ng/mL
Methadone Scn, Ur: NEGATIVE ng/mL
OXYCODONE+OXYMORPHONE UR QL SCN: NEGATIVE ng/mL
Opiate Scrn, Ur: NEGATIVE ng/mL
PCP Scrn, Ur: NEGATIVE ng/mL
PH UR, DRUG SCRN: 6.6 (ref 4.5–8.9)
Propoxyphene, Screen: NEGATIVE ng/mL

## 2016-05-26 LAB — HEPATITIS B SURFACE ANTIGEN: Hepatitis B Surface Ag: NEGATIVE

## 2016-05-26 LAB — ANTIBODY SCREEN: Antibody Screen: NEGATIVE

## 2016-05-26 LAB — ABO/RH: Rh Factor: POSITIVE

## 2016-05-26 LAB — HIV ANTIBODY (ROUTINE TESTING W REFLEX): HIV SCREEN 4TH GENERATION: NONREACTIVE

## 2016-05-26 LAB — RUBELLA SCREEN: Rubella Antibodies, IGG: 1.51 index (ref >0.99)

## 2016-05-26 LAB — RPR: RPR Ser Ql: NONREACTIVE

## 2016-05-26 LAB — VARICELLA ZOSTER ANTIBODY, IGG: VARICELLA: 1168 {index} (ref >165)

## 2016-05-28 LAB — GC/CHLAMYDIA PROBE AMP
Chlamydia trachomatis, NAA: NEGATIVE
NEISSERIA GONORRHOEAE BY PCR: NEGATIVE

## 2016-06-07 ENCOUNTER — Ambulatory Visit (INDEPENDENT_AMBULATORY_CARE_PROVIDER_SITE_OTHER): Payer: 59 | Admitting: Advanced Practice Midwife

## 2016-06-07 ENCOUNTER — Encounter: Payer: Self-pay | Admitting: Advanced Practice Midwife

## 2016-06-07 ENCOUNTER — Other Ambulatory Visit: Payer: 59

## 2016-06-07 ENCOUNTER — Ambulatory Visit (INDEPENDENT_AMBULATORY_CARE_PROVIDER_SITE_OTHER): Payer: 59

## 2016-06-07 VITALS — BP 110/80 | HR 80 | Wt 212.4 lb

## 2016-06-07 DIAGNOSIS — O09299 Supervision of pregnancy with other poor reproductive or obstetric history, unspecified trimester: Secondary | ICD-10-CM

## 2016-06-07 DIAGNOSIS — Z131 Encounter for screening for diabetes mellitus: Secondary | ICD-10-CM | POA: Diagnosis not present

## 2016-06-07 DIAGNOSIS — Z8632 Personal history of gestational diabetes: Secondary | ICD-10-CM

## 2016-06-07 DIAGNOSIS — Z98891 History of uterine scar from previous surgery: Secondary | ICD-10-CM

## 2016-06-07 DIAGNOSIS — Z3A13 13 weeks gestation of pregnancy: Secondary | ICD-10-CM | POA: Diagnosis not present

## 2016-06-07 DIAGNOSIS — Z1389 Encounter for screening for other disorder: Secondary | ICD-10-CM

## 2016-06-07 DIAGNOSIS — Z3A12 12 weeks gestation of pregnancy: Secondary | ICD-10-CM

## 2016-06-07 DIAGNOSIS — Z3481 Encounter for supervision of other normal pregnancy, first trimester: Secondary | ICD-10-CM

## 2016-06-07 DIAGNOSIS — Z3682 Encounter for antenatal screening for nuchal translucency: Secondary | ICD-10-CM

## 2016-06-07 DIAGNOSIS — Z3401 Encounter for supervision of normal first pregnancy, first trimester: Secondary | ICD-10-CM

## 2016-06-07 DIAGNOSIS — Z331 Pregnant state, incidental: Secondary | ICD-10-CM

## 2016-06-07 LAB — POCT URINALYSIS DIPSTICK
Blood, UA: NEGATIVE
Glucose, UA: NEGATIVE
Ketones, UA: NEGATIVE
Leukocytes, UA: NEGATIVE
Nitrite, UA: NEGATIVE
Protein, UA: NEGATIVE

## 2016-06-07 MED ORDER — ASPIRIN EC 81 MG PO TBEC: 81.0000 mg | DELAYED_RELEASE_TABLET | Freq: Every day | ORAL | 6 refills | Status: DC

## 2016-06-07 NOTE — Progress Notes (Signed)
[id number] 3070w6d Estimated Date of Delivery: 12/21/16  Blood pressure 110/80, pulse 80, weight 212 lb 6.4 oz (96.3 kg), last menstrual period 02/27/2016.   BP weight and urine results all reviewed and noted.  Please refer to the obstetrical flow sheet for the fundal height and fetal heart rate documentation:  Patient denies any bleeding and no rupture of membranes symptoms or regular contractions. Patient is without complaints.  US 11+6 wks,measurements c/w dates,crl 53.9 mm,NB present,NT 1.2 mm,fhr 154 bpm,post pl gr 0,normal ov's bilat    All questions were answered.  Orders Placed This Encounter  Procedures  . Maternal Screen, Integrated #1    Plan:  Continued routine obstetrical care, early Gtt today  Return in about 4 weeks (around 07/05/2016) for LROB, 2nd IT.

## 2016-06-07 NOTE — Progress Notes (Signed)
US 11+6 wks,measurements c/w dates,crl 53.9 mm,NB present,NT 1.2 mm,fhr 154 bpm,post pl gr 0,normal ov's bilat

## 2016-06-07 NOTE — Addendum Note (Signed)
Addended by: Federico FlakeNES, Anatole Apollo A on: 06/07/2016 12:11 PM   Modules accepted: Orders

## 2016-06-08 LAB — GLUCOSE TOLERANCE, 2 HOURS W/ 1HR
GLUCOSE, 2 HOUR: 109 mg/dL (ref 65–152)
Glucose, 1 hour: 161 mg/dL (ref 65–179)
Glucose, Fasting: 78 mg/dL (ref 65–91)

## 2016-06-11 LAB — MATERNAL SCREEN, INTEGRATED #1
Crown Rump Length: 53.9 mm
GEST. AGE ON COLLECTION DATE: 12 wk
MATERNAL AGE AT EDD: 34.6 a
NUCHAL TRANSLUCENCY (NT): 1.2 mm
NUMBER OF FETUSES: 1
PAPP-A Value: 347 ng/mL
WEIGHT: 212 [lb_av]

## 2016-06-22 ENCOUNTER — Telehealth: Payer: Self-pay | Admitting: Women's Health

## 2016-06-22 NOTE — Telephone Encounter (Signed)
Patient called stating she has a cough, sore throat, and congestion. She has not had a fever. She has not tried taking anything since she did not know what to take. I advised patient she could take sudafed, Tylenol, Claritin, cold-ease. I encouraged her to run a humidifier, push fluids and use Vicks Vaporub if needed. I advised her to let us know if symptoms get worse or she thinks she needs to be seen. Pt verbalized understanding.

## 2016-07-05 ENCOUNTER — Ambulatory Visit (INDEPENDENT_AMBULATORY_CARE_PROVIDER_SITE_OTHER): Payer: 59 | Admitting: Women's Health

## 2016-07-05 ENCOUNTER — Encounter: Payer: Self-pay | Admitting: Women's Health

## 2016-07-05 VITALS — BP 132/82 | HR 65 | Wt 218.0 lb

## 2016-07-05 DIAGNOSIS — Z3A16 16 weeks gestation of pregnancy: Secondary | ICD-10-CM

## 2016-07-05 DIAGNOSIS — Z3682 Encounter for antenatal screening for nuchal translucency: Secondary | ICD-10-CM | POA: Diagnosis not present

## 2016-07-05 DIAGNOSIS — Z3482 Encounter for supervision of other normal pregnancy, second trimester: Secondary | ICD-10-CM

## 2016-07-05 DIAGNOSIS — Z331 Pregnant state, incidental: Secondary | ICD-10-CM

## 2016-07-05 DIAGNOSIS — Z1389 Encounter for screening for other disorder: Secondary | ICD-10-CM

## 2016-07-05 DIAGNOSIS — Z363 Encounter for antenatal screening for malformations: Secondary | ICD-10-CM

## 2016-07-05 DIAGNOSIS — O99212 Obesity complicating pregnancy, second trimester: Secondary | ICD-10-CM

## 2016-07-05 DIAGNOSIS — O1202 Gestational edema, second trimester: Secondary | ICD-10-CM

## 2016-07-05 LAB — POCT URINALYSIS DIPSTICK
Blood, UA: NEGATIVE
Glucose, UA: NEGATIVE
Ketones, UA: NEGATIVE
LEUKOCYTES UA: NEGATIVE
NITRITE UA: NEGATIVE
PROTEIN UA: NEGATIVE

## 2016-07-05 NOTE — Progress Notes (Signed)
Low-risk OB appointment [id number] Estimated Date of Delivery: 12/21/16 BP 132/82   Pulse 65   Wt 218 lb (98.9 kg)   LMP 02/27/2016 (Exact Date)   BMI 37.42 kg/m   BP, weight, and urine reviewed.  Refer to obstetrical flow sheet for FH & FHR.  No fm yet. Denies cramping, lof, vb, or uti s/s. Some swelling hands/feet.  Reviewed warning s/s to report, normal early 2hr gtt. Mychart is active, but states she can't access it, to reset password and try again.  Plan:  Continue routine obstetrical care  F/U in 4wks for OB appointment and anatomy u/s 2nd IT today

## 2016-07-05 NOTE — Patient Instructions (Signed)
Second Trimester of Pregnancy The second trimester is from week 13 through week 28 (months 4 through 6). The second trimester is often a time when you feel your best. Your body has also adjusted to being pregnant, and you begin to feel better physically. Usually, morning sickness has lessened or quit completely, you may have more energy, and you may have an increase in appetite. The second trimester is also a time when the fetus is growing rapidly. At the end of the sixth month, the fetus is about 9 inches long and weighs about 1 pounds. You will likely begin to feel the baby move (quickening) between 18 and 20 weeks of the pregnancy. Body changes during your second trimester Your body continues to go through many changes during your second trimester. The changes vary from woman to woman.  Your weight will continue to increase. You will notice your lower abdomen bulging out.  You may begin to get stretch marks on your hips, abdomen, and breasts.  You may develop headaches that can be relieved by medicines. The medicines should be approved by your health care provider.  You may urinate more often because the fetus is pressing on your bladder.  You may develop or continue to have heartburn as a result of your pregnancy.  You may develop constipation because certain hormones are causing the muscles that push waste through your intestines to slow down.  You may develop hemorrhoids or swollen, bulging veins (varicose veins).  You may have back pain. This is caused by:  Weight gain.  Pregnancy hormones that are relaxing the joints in your pelvis.  A shift in weight and the muscles that support your balance.  Your breasts will continue to grow and they will continue to become tender.  Your gums may bleed and may be sensitive to brushing and flossing.  Dark spots or blotches (chloasma, mask of pregnancy) may develop on your face. This will likely fade after the baby is born.  A dark line  from your belly button to the pubic area (linea nigra) may appear. This will likely fade after the baby is born.  You may have changes in your hair. These can include thickening of your hair, rapid growth, and changes in texture. Some women also have hair loss during or after pregnancy, or hair that feels dry or thin. Your hair will most likely return to normal after your baby is born. What to expect at prenatal visits During a routine prenatal visit:  You will be weighed to make sure you and the fetus are growing normally.  Your blood pressure will be taken.  Your abdomen will be measured to track your baby's growth.  The fetal heartbeat will be listened to.  Any test results from the previous visit will be discussed. Your health care provider may ask you:  How you are feeling.  If you are feeling the baby move.  If you have had any abnormal symptoms, such as leaking fluid, bleeding, severe headaches, or abdominal cramping.  If you are using any tobacco products, including cigarettes, chewing tobacco, and electronic cigarettes.  If you have any questions. Other tests that may be performed during your second trimester include:  Blood tests that check for:  Low iron levels (anemia).  Gestational diabetes (between 24 and 28 weeks).  Rh antibodies. This is to check for a protein on red blood cells (Rh factor).  Urine tests to check for infections, diabetes, or protein in the urine.  An ultrasound to   confirm the proper growth and development of the baby.  An amniocentesis to check for possible genetic problems.  Fetal screens for spina bifida and Down syndrome.  HIV (human immunodeficiency virus) testing. Routine prenatal testing includes screening for HIV, unless you choose not to have this test. Follow these instructions at home: Eating and drinking  Continue to eat regular, healthy meals.  Avoid raw meat, uncooked cheese, cat litter boxes, and soil used by cats. These  carry germs that can cause birth defects in the baby.  Take your prenatal vitamins.  Take 1500-2000 mg of calcium daily starting at the 20th week of pregnancy until you deliver your baby.  If you develop constipation:  Take over-the-counter or prescription medicines.  Drink enough fluid to keep your urine clear or pale yellow.  Eat foods that are high in fiber, such as fresh fruits and vegetables, whole grains, and beans.  Limit foods that are high in fat and processed sugars, such as fried and sweet foods. Activity  Exercise only as directed by your health care provider. Experiencing uterine cramps is a good sign to stop exercising.  Avoid heavy lifting, wear low heel shoes, and practice good posture.  Wear your seat belt at all times when driving.  Rest with your legs elevated if you have leg cramps or low back pain.  Wear a good support bra for breast tenderness.  Do not use hot tubs, steam rooms, or saunas. Lifestyle  Avoid all smoking, herbs, alcohol, and unprescribed drugs. These chemicals affect the formation and growth of the baby.  Do not use any products that contain nicotine or tobacco, such as cigarettes and e-cigarettes. If you need help quitting, ask your health care provider.  A sexual relationship may be continued unless your health care provider directs you otherwise. General instructions  Follow your health care provider's instructions regarding medicine use. There are medicines that are either safe or unsafe to take during pregnancy.  Take warm sitz baths to soothe any pain or discomfort caused by hemorrhoids. Use hemorrhoid cream if your health care provider approves.  If you develop varicose veins, wear support hose. Elevate your feet for 15 minutes, 3-4 times a day. Limit salt in your diet.  Visit your dentist if you have not gone yet during your pregnancy. Use a soft toothbrush to brush your teeth and be gentle when you floss.  Keep all follow-up  prenatal visits as told by your health care provider. This is important. Contact a health care provider if:  You have dizziness.  You have mild pelvic cramps, pelvic pressure, or nagging pain in the abdominal area.  You have persistent nausea, vomiting, or diarrhea.  You have a bad smelling vaginal discharge.  You have pain with urination. Get help right away if:  You have a fever.  You are leaking fluid from your vagina.  You have spotting or bleeding from your vagina.  You have severe abdominal cramping or pain.  You have rapid weight gain or weight loss.  You have shortness of breath with chest pain.  You notice sudden or extreme swelling of your face, hands, ankles, feet, or legs.  You have not felt your baby move in over an hour.  You have severe headaches that do not go away with medicine.  You have vision changes. Summary  The second trimester is from week 13 through week 28 (months 4 through 6). It is also a time when the fetus is growing rapidly.  Your body goes   through many changes during pregnancy. The changes vary from woman to woman.  Avoid all smoking, herbs, alcohol, and unprescribed drugs. These chemicals affect the formation and growth your baby.  Do not use any tobacco products, such as cigarettes, chewing tobacco, and e-cigarettes. If you need help quitting, ask your health care provider.  Contact your health care provider if you have any questions. Keep all prenatal visits as told by your health care provider. This is important. This information is not intended to replace advice given to you by your health care provider. Make sure you discuss any questions you have with your health care provider. Document Released: 06/05/2001 Document Revised: 11/17/2015 Document Reviewed: 08/12/2012 Elsevier Interactive Patient Education  2017 [employer]  

## 2016-07-11 LAB — MATERNAL SCREEN, INTEGRATED #2
AFP MoM: 1.27
Alpha-Fetoprotein: 29.3 ng/mL
CROWN RUMP LENGTH: 53.9 mm
DIA MOM: 0.63
DIA Value: 90.4 pg/mL
Estriol, Unconjugated: 1.29 ng/mL
GESTATIONAL AGE: 16 wk
Gest. Age on Collection Date: 12 weeks
Maternal Age at EDD: 34.6 years
NUCHAL TRANSLUCENCY MOM: 0.84
Nuchal Translucency (NT): 1.2 mm
Number of Fetuses: 1
PAPP-A MOM: 0.67
PAPP-A Value: 347 ng/mL
TEST RESULTS: NEGATIVE
WEIGHT: 218 [lb_av]
Weight: 212 [lb_av]
hCG MoM: 1.37
hCG Value: 36.6 IU/mL
uE3 MoM: 1.78

## 2016-07-29 ENCOUNTER — Telehealth: Payer: 59 | Admitting: Family

## 2016-07-29 ENCOUNTER — Encounter: Payer: Self-pay | Admitting: Women's Health

## 2016-07-29 DIAGNOSIS — R6889 Other general symptoms and signs: Secondary | ICD-10-CM

## 2016-07-29 MED ORDER — OSELTAMIVIR PHOSPHATE 75 MG PO CAPS: 75.0000 mg | ORAL_CAPSULE | Freq: Two times a day (BID) | ORAL | 0 refills | Status: DC

## 2016-07-29 NOTE — Progress Notes (Signed)

## 2016-07-30 ENCOUNTER — Telehealth: Payer: Self-pay | Admitting: Obstetrics & Gynecology

## 2016-07-30 ENCOUNTER — Other Ambulatory Visit: Payer: Self-pay | Admitting: Women's Health

## 2016-07-30 NOTE — Telephone Encounter (Signed)
Pt states her child was diagnosed with flu on Friday, then Saturday pt stated not feeling well, she had an E-visit on Sunday and based on her symptoms was RX'd Tamiflu.  After her first dose of it she threw up about 30 minutes later.  Pt just wanting to make sure it is still safe to take.  Informed pt it is OK to take if has the Flu, we don't use it for preventative measures in pregnancy.  Advised pt to push water to stay well hydrated and use Tylenol PRN for fever.  Pt verbalized understanding.

## 2016-07-30 NOTE — Telephone Encounter (Signed)
Pt called stating that she is [redacted] weeks pregnant and over the weekend her son was diagnosed with the Flu and she went to the urgent care and they have prescribed her TamiFlu. Pt states that she has taken the medication yesterday and threw up. Pt want to know if it is safe to keep taking

## 2016-08-01 ENCOUNTER — Ambulatory Visit (INDEPENDENT_AMBULATORY_CARE_PROVIDER_SITE_OTHER): Payer: 59 | Admitting: Women's Health

## 2016-08-01 ENCOUNTER — Encounter: Payer: Self-pay | Admitting: Women's Health

## 2016-08-01 ENCOUNTER — Ambulatory Visit (INDEPENDENT_AMBULATORY_CARE_PROVIDER_SITE_OTHER): Payer: 59

## 2016-08-01 VITALS — BP 128/72 | HR 64 | Wt 218.0 lb

## 2016-08-01 DIAGNOSIS — O09892 Supervision of other high risk pregnancies, second trimester: Secondary | ICD-10-CM

## 2016-08-01 DIAGNOSIS — Z363 Encounter for antenatal screening for malformations: Secondary | ICD-10-CM | POA: Diagnosis not present

## 2016-08-01 DIAGNOSIS — Z8632 Personal history of gestational diabetes: Secondary | ICD-10-CM

## 2016-08-01 DIAGNOSIS — Z3482 Encounter for supervision of other normal pregnancy, second trimester: Secondary | ICD-10-CM

## 2016-08-01 DIAGNOSIS — O358XX1 Maternal care for other (suspected) fetal abnormality and damage, fetus 1: Secondary | ICD-10-CM

## 2016-08-01 DIAGNOSIS — O09299 Supervision of pregnancy with other poor reproductive or obstetric history, unspecified trimester: Secondary | ICD-10-CM

## 2016-08-01 DIAGNOSIS — Z3A20 20 weeks gestation of pregnancy: Secondary | ICD-10-CM

## 2016-08-01 DIAGNOSIS — O09899 Supervision of other high risk pregnancies, unspecified trimester: Secondary | ICD-10-CM | POA: Insufficient documentation

## 2016-08-01 DIAGNOSIS — Z331 Pregnant state, incidental: Secondary | ICD-10-CM

## 2016-08-01 DIAGNOSIS — Z1389 Encounter for screening for other disorder: Secondary | ICD-10-CM

## 2016-08-01 DIAGNOSIS — Z3402 Encounter for supervision of normal first pregnancy, second trimester: Secondary | ICD-10-CM

## 2016-08-01 DIAGNOSIS — Z98891 History of uterine scar from previous surgery: Secondary | ICD-10-CM

## 2016-08-01 LAB — POCT URINALYSIS DIPSTICK
GLUCOSE UA: NEGATIVE
KETONES UA: NEGATIVE
Leukocytes, UA: NEGATIVE
Nitrite, UA: NEGATIVE
Protein, UA: NEGATIVE
RBC UA: NEGATIVE

## 2016-08-01 NOTE — Progress Notes (Signed)
Low-risk OB appointment [id number] Estimated Date of Delivery: 12/21/16 BP 128/72   Pulse 64   Wt 218 lb (98.9 kg)   LMP 02/27/2016 (Exact Date)   BMI 37.42 kg/m   BP, weight, and urine reviewed.  Refer to obstetrical flow sheet for FH & FHR.  Reports good fm.  Denies regular uc's, lof, vb, or uti s/s. Still thinking about vbac, but leaning towards repeat c/s, also considering btl. Discussed both.  Recovering from the flu, was unable to take tamiflu as she kept throwing it up. Still has bad cough.  HRRR, LCTAB Reviewed today's anatomy u/s, 2VC, otherwise normal- discussed and gave printed info on 2VC. Nt/it was neg. Discussed warning s/s to report, fm Plan:  Continue routine obstetrical care  F/U in 4wks for OB appointment  Plan u/s @ 28, 32, 36wks for [email or web address]

## 2016-08-01 NOTE — Progress Notes (Signed)
US 19+5 wks,transverse head left,cx 4 cm,post pl gr 0,normal ov's bilat,svp of fluid 4 cm,fhr 143 bpm,2VC absent left UA,efw 299 g,anatomy complete

## 2016-08-01 NOTE — Patient Instructions (Signed)
Second Trimester of Pregnancy The second trimester is from week 13 through week 28 (months 4 through 6). The second trimester is often a time when you feel your best. Your body has also adjusted to being pregnant, and you begin to feel better physically. Usually, morning sickness has lessened or quit completely, you may have more energy, and you may have an increase in appetite. The second trimester is also a time when the fetus is growing rapidly. At the end of the sixth month, the fetus is about 9 inches long and weighs about 1 pounds. You will likely begin to feel the baby move (quickening) between 18 and 20 weeks of the pregnancy. Body changes during your second trimester Your body continues to go through many changes during your second trimester. The changes vary from woman to woman.  Your weight will continue to increase. You will notice your lower abdomen bulging out.  You may begin to get stretch marks on your hips, abdomen, and breasts.  You may develop headaches that can be relieved by medicines. The medicines should be approved by your health care provider.  You may urinate more often because the fetus is pressing on your bladder.  You may develop or continue to have heartburn as a result of your pregnancy.  You may develop constipation because certain hormones are causing the muscles that push waste through your intestines to slow down.  You may develop hemorrhoids or swollen, bulging veins (varicose veins).  You may have back pain. This is caused by:  Weight gain.  Pregnancy hormones that are relaxing the joints in your pelvis.  A shift in weight and the muscles that support your balance.  Your breasts will continue to grow and they will continue to become tender.  Your gums may bleed and may be sensitive to brushing and flossing.  Dark spots or blotches (chloasma, mask of pregnancy) may develop on your face. This will likely fade after the baby is born.  A dark line  from your belly button to the pubic area (linea nigra) may appear. This will likely fade after the baby is born.  You may have changes in your hair. These can include thickening of your hair, rapid growth, and changes in texture. Some women also have hair loss during or after pregnancy, or hair that feels dry or thin. Your hair will most likely return to normal after your baby is born. What to expect at prenatal visits During a routine prenatal visit:  You will be weighed to make sure you and the fetus are growing normally.  Your blood pressure will be taken.  Your abdomen will be measured to track your baby's growth.  The fetal heartbeat will be listened to.  Any test results from the previous visit will be discussed. Your health care provider may ask you:  How you are feeling.  If you are feeling the baby move.  If you have had any abnormal symptoms, such as leaking fluid, bleeding, severe headaches, or abdominal cramping.  If you are using any tobacco products, including cigarettes, chewing tobacco, and electronic cigarettes.  If you have any questions. Other tests that may be performed during your second trimester include:  Blood tests that check for:  Low iron levels (anemia).  Gestational diabetes (between 24 and 28 weeks).  Rh antibodies. This is to check for a protein on red blood cells (Rh factor).  Urine tests to check for infections, diabetes, or protein in the urine.  An ultrasound to   confirm the proper growth and development of the baby.  An amniocentesis to check for possible genetic problems.  Fetal screens for spina bifida and Down syndrome.  HIV (human immunodeficiency virus) testing. Routine prenatal testing includes screening for HIV, unless you choose not to have this test. Follow these instructions at home: Eating and drinking  Continue to eat regular, healthy meals.  Avoid raw meat, uncooked cheese, cat litter boxes, and soil used by cats. These  carry germs that can cause birth defects in the baby.  Take your prenatal vitamins.  Take 1500-2000 mg of calcium daily starting at the 20th week of pregnancy until you deliver your baby.  If you develop constipation:  Take over-the-counter or prescription medicines.  Drink enough fluid to keep your urine clear or pale yellow.  Eat foods that are high in fiber, such as fresh fruits and vegetables, whole grains, and beans.  Limit foods that are high in fat and processed sugars, such as fried and sweet foods. Activity  Exercise only as directed by your health care provider. Experiencing uterine cramps is a good sign to stop exercising.  Avoid heavy lifting, wear low heel shoes, and practice good posture.  Wear your seat belt at all times when driving.  Rest with your legs elevated if you have leg cramps or low back pain.  Wear a good support bra for breast tenderness.  Do not use hot tubs, steam rooms, or saunas. Lifestyle  Avoid all smoking, herbs, alcohol, and unprescribed drugs. These chemicals affect the formation and growth of the baby.  Do not use any products that contain nicotine or tobacco, such as cigarettes and e-cigarettes. If you need help quitting, ask your health care provider.  A sexual relationship may be continued unless your health care provider directs you otherwise. General instructions  Follow your health care provider's instructions regarding medicine use. There are medicines that are either safe or unsafe to take during pregnancy.  Take warm sitz baths to soothe any pain or discomfort caused by hemorrhoids. Use hemorrhoid cream if your health care provider approves.  If you develop varicose veins, wear support hose. Elevate your feet for 15 minutes, 3-4 times a day. Limit salt in your diet.  Visit your dentist if you have not gone yet during your pregnancy. Use a soft toothbrush to brush your teeth and be gentle when you floss.  Keep all follow-up  prenatal visits as told by your health care provider. This is important. Contact a health care provider if:  You have dizziness.  You have mild pelvic cramps, pelvic pressure, or nagging pain in the abdominal area.  You have persistent nausea, vomiting, or diarrhea.  You have a bad smelling vaginal discharge.  You have pain with urination. Get help right away if:  You have a fever.  You are leaking fluid from your vagina.  You have spotting or bleeding from your vagina.  You have severe abdominal cramping or pain.  You have rapid weight gain or weight loss.  You have shortness of breath with chest pain.  You notice sudden or extreme swelling of your face, hands, ankles, feet, or legs.  You have not felt your baby move in over an hour.  You have severe headaches that do not go away with medicine.  You have vision changes. Summary  The second trimester is from week 13 through week 28 (months 4 through 6). It is also a time when the fetus is growing rapidly.  Your body goes   through many changes during pregnancy. The changes vary from woman to woman.  Avoid all smoking, herbs, alcohol, and unprescribed drugs. These chemicals affect the formation and growth your baby.  Do not use any tobacco products, such as cigarettes, chewing tobacco, and e-cigarettes. If you need help quitting, ask your health care provider.  Contact your health care provider if you have any questions. Keep all prenatal visits as told by your health care provider. This is important. This information is not intended to replace advice given to you by your health care provider. Make sure you discuss any questions you have with your health care provider. Document Released: 06/05/2001 Document Revised: 11/17/2015 Document Reviewed: 08/12/2012 Elsevier Interactive Patient Education  2017 [employer]  

## 2016-08-29 ENCOUNTER — Encounter: Payer: Self-pay | Admitting: Women's Health

## 2016-08-29 ENCOUNTER — Ambulatory Visit (INDEPENDENT_AMBULATORY_CARE_PROVIDER_SITE_OTHER): Payer: 59 | Admitting: Women's Health

## 2016-08-29 VITALS — BP 114/62 | HR 68 | Wt 227.0 lb

## 2016-08-29 DIAGNOSIS — O09899 Supervision of other high risk pregnancies, unspecified trimester: Secondary | ICD-10-CM

## 2016-08-29 DIAGNOSIS — O358XX1 Maternal care for other (suspected) fetal abnormality and damage, fetus 1: Secondary | ICD-10-CM

## 2016-08-29 DIAGNOSIS — Z331 Pregnant state, incidental: Secondary | ICD-10-CM

## 2016-08-29 DIAGNOSIS — Z3A20 20 weeks gestation of pregnancy: Secondary | ICD-10-CM

## 2016-08-29 DIAGNOSIS — Z1389 Encounter for screening for other disorder: Secondary | ICD-10-CM

## 2016-08-29 DIAGNOSIS — Z3482 Encounter for supervision of other normal pregnancy, second trimester: Secondary | ICD-10-CM

## 2016-08-29 LAB — POCT URINALYSIS DIPSTICK
Glucose, UA: NEGATIVE
Ketones, UA: NEGATIVE
Leukocytes, UA: NEGATIVE
Nitrite, UA: NEGATIVE
PROTEIN UA: NEGATIVE

## 2016-08-29 NOTE — Patient Instructions (Signed)
You will have your sugar test next visit.  Please do not eat or drink anything after midnight the night before you come, not even water.  You will be here for at least two hours.     Call the office ([phone]) or go to [facility] if:  You begin to have strong, frequent contractions  Your water breaks.  Sometimes it is a big gush of fluid, sometimes it is just a trickle that keeps getting your panties wet or running down your legs  You have vaginal bleeding.  It is normal to have a small amount of spotting if your cervix was checked.   You don't feel your baby moving like normal.  If you don't, get you something to eat and drink and lay down and focus on feeling your baby move.   If your baby is still not moving like normal, you should call the office or go to [facility].  Second Trimester of Pregnancy The second trimester is from week 13 through week 28, months 4 through 6. The second trimester is often a time when you feel your best. Your body has also adjusted to being pregnant, and you begin to feel better physically. Usually, morning sickness has lessened or quit completely, you may have more energy, and you may have an increase in appetite. The second trimester is also a time when the fetus is growing rapidly. At the end of the sixth month, the fetus is about 9 inches long and weighs about 1 pounds. You will likely begin to feel the baby move (quickening) between 18 and 20 weeks of the pregnancy. BODY CHANGES Your body goes through many changes during pregnancy. The changes vary from woman to woman.   Your weight will continue to increase. You will notice your lower abdomen bulging out.  You may begin to get stretch marks on your hips, abdomen, and breasts.  You may develop headaches that can be relieved by medicines approved by your health care provider.  You may urinate more often because the fetus is pressing on your bladder.  You may develop or continue to have  heartburn as a result of your pregnancy.  You may develop constipation because certain hormones are causing the muscles that push waste through your intestines to slow down.  You may develop hemorrhoids or swollen, bulging veins (varicose veins).  You may have back pain because of the weight gain and pregnancy hormones relaxing your joints between the bones in your pelvis and as a result of a shift in weight and the muscles that support your balance.  Your breasts will continue to grow and be tender.  Your gums may bleed and may be sensitive to brushing and flossing.  Dark spots or blotches (chloasma, mask of pregnancy) may develop on your face. This will likely fade after the baby is born.  A dark line from your belly button to the pubic area (linea nigra) may appear. This will likely fade after the baby is born.  You may have changes in your hair. These can include thickening of your hair, rapid growth, and changes in texture. Some women also have hair loss during or after pregnancy, or hair that feels dry or thin. Your hair will most likely return to normal after your baby is born. WHAT TO EXPECT AT YOUR PRENATAL VISITS During a routine prenatal visit:  You will be weighed to make sure you and the fetus are growing normally.  Your blood pressure will be taken.    Your abdomen will be measured to track your baby's growth.  The fetal heartbeat will be listened to.  Any test results from the previous visit will be discussed. Your health care provider may ask you:  How you are feeling.  If you are feeling the baby move.  If you have had any abnormal symptoms, such as leaking fluid, bleeding, severe headaches, or abdominal cramping.  If you have any questions. Other tests that may be performed during your second trimester include:  Blood tests that check for:  Low iron levels (anemia).  Gestational diabetes (between 24 and 28 weeks).  Rh antibodies.  Urine tests to check  for infections, diabetes, or protein in the urine.  An ultrasound to confirm the proper growth and development of the baby.  An amniocentesis to check for possible genetic problems.  Fetal screens for spina bifida and Down syndrome. HOME CARE INSTRUCTIONS   Avoid all smoking, herbs, alcohol, and unprescribed drugs. These chemicals affect the formation and growth of the baby.  Follow your health care provider's instructions regarding medicine use. There are medicines that are either safe or unsafe to take during pregnancy.  Exercise only as directed by your health care provider. Experiencing uterine cramps is a good sign to stop exercising.  Continue to eat regular, healthy meals.  Wear a good support bra for breast tenderness.  Do not use hot tubs, steam rooms, or saunas.  Wear your seat belt at all times when driving.  Avoid raw meat, uncooked cheese, cat litter boxes, and soil used by cats. These carry germs that can cause birth defects in the baby.  Take your prenatal vitamins.  Try taking a stool softener (if your health care provider approves) if you develop constipation. Eat more high-fiber foods, such as fresh vegetables or fruit and whole grains. Drink plenty of fluids to keep your urine clear or pale yellow.  Take warm sitz baths to soothe any pain or discomfort caused by hemorrhoids. Use hemorrhoid cream if your health care provider approves.  If you develop varicose veins, wear support hose. Elevate your feet for 15 minutes, 3-4 times a day. Limit salt in your diet.  Avoid heavy lifting, wear low heel shoes, and practice good posture.  Rest with your legs elevated if you have leg cramps or low back pain.  Visit your dentist if you have not gone yet during your pregnancy. Use a soft toothbrush to brush your teeth and be gentle when you floss.  A sexual relationship may be continued unless your health care provider directs you otherwise.  Continue to go to all your  prenatal visits as directed by your health care provider. SEEK MEDICAL CARE IF:   You have dizziness.  You have mild pelvic cramps, pelvic pressure, or nagging pain in the abdominal area.  You have persistent nausea, vomiting, or diarrhea.  You have a bad smelling vaginal discharge.  You have pain with urination. SEEK IMMEDIATE MEDICAL CARE IF:   You have a fever.  You are leaking fluid from your vagina.  You have spotting or bleeding from your vagina.  You have severe abdominal cramping or pain.  You have rapid weight gain or loss.  You have shortness of breath with chest pain.  You notice sudden or extreme swelling of your face, hands, ankles, feet, or legs.  You have not felt your baby move in over an hour.  You have severe headaches that do not go away with medicine.  You have vision changes.   Document Released: 06/05/2001 Document Revised: 06/16/2013 Document Reviewed: 08/12/2012 [employer] Patient Information 2015 [employer]. This information is not intended to replace advice given to you by your health care provider. Make sure you discuss any questions you have with your health care provider.     

## 2016-09-25 ENCOUNTER — Telehealth: Payer: Self-pay | Admitting: *Deleted

## 2016-09-25 NOTE — Telephone Encounter (Signed)
Patient called with concerns of light pink spotting x1 when she voided earlier today along with tightening in her abdomen. She reports she had intercourse 4 days ago. Informed patient that since the bleeding only occurred one time, it was nothing to worry about at this time but to continue to monitor and if it became heavier, to call us back or go to [facility]. Encouraged patient to push fluids and rest when possible and to empty her bladder frequently since a full bladder can cause contractions. Pt verbalized understanding.

## 2016-09-26 ENCOUNTER — Ambulatory Visit (INDEPENDENT_AMBULATORY_CARE_PROVIDER_SITE_OTHER): Payer: 59

## 2016-09-26 ENCOUNTER — Other Ambulatory Visit: Payer: 59

## 2016-09-26 ENCOUNTER — Encounter: Payer: Self-pay | Admitting: Advanced Practice Midwife

## 2016-09-26 ENCOUNTER — Ambulatory Visit (INDEPENDENT_AMBULATORY_CARE_PROVIDER_SITE_OTHER): Payer: 59 | Admitting: Advanced Practice Midwife

## 2016-09-26 VITALS — BP 110/70 | HR 78 | Wt 232.0 lb

## 2016-09-26 DIAGNOSIS — Z331 Pregnant state, incidental: Secondary | ICD-10-CM

## 2016-09-26 DIAGNOSIS — Z131 Encounter for screening for diabetes mellitus: Secondary | ICD-10-CM

## 2016-09-26 DIAGNOSIS — Z8632 Personal history of gestational diabetes: Secondary | ICD-10-CM

## 2016-09-26 DIAGNOSIS — O09899 Supervision of other high risk pregnancies, unspecified trimester: Secondary | ICD-10-CM | POA: Diagnosis not present

## 2016-09-26 DIAGNOSIS — Z3482 Encounter for supervision of other normal pregnancy, second trimester: Secondary | ICD-10-CM | POA: Diagnosis not present

## 2016-09-26 DIAGNOSIS — O0992 Supervision of high risk pregnancy, unspecified, second trimester: Secondary | ICD-10-CM

## 2016-09-26 DIAGNOSIS — Z3402 Encounter for supervision of normal first pregnancy, second trimester: Secondary | ICD-10-CM

## 2016-09-26 DIAGNOSIS — Z3A27 27 weeks gestation of pregnancy: Secondary | ICD-10-CM

## 2016-09-26 DIAGNOSIS — Z98891 History of uterine scar from previous surgery: Secondary | ICD-10-CM

## 2016-09-26 DIAGNOSIS — O09299 Supervision of pregnancy with other poor reproductive or obstetric history, unspecified trimester: Secondary | ICD-10-CM

## 2016-09-26 DIAGNOSIS — Z1389 Encounter for screening for other disorder: Secondary | ICD-10-CM

## 2016-09-26 LAB — POCT URINALYSIS DIPSTICK
GLUCOSE UA: NEGATIVE
Ketones, UA: NEGATIVE
Leukocytes, UA: NEGATIVE
NITRITE UA: NEGATIVE

## 2016-09-26 NOTE — Progress Notes (Signed)
[place] 27+5 wks,frank breech,post pl gr 0,normal ov's bilat,cx 4.3 cm,afi 16.6 cm,2vc, fhr 137 bpm,EFW 1173 g 57%

## 2016-09-26 NOTE — Patient Instructions (Signed)
Third Trimester of Pregnancy The third trimester is from week 28 through week 40 (months 7 through 9). The third trimester is a time when the unborn baby (fetus) is growing rapidly. At the end of the ninth month, the fetus is about 20 inches in length and weighs 6-10 pounds. Body changes during your third trimester Your body will continue to go through many changes during pregnancy. The changes vary from woman to woman. During the third trimester:  Your weight will continue to increase. You can expect to gain 25-35 pounds (11-16 kg) by the end of the pregnancy.  You may begin to get stretch marks on your hips, abdomen, and breasts.  You may urinate more often because the fetus is moving lower into your pelvis and pressing on your bladder.  You may develop or continue to have heartburn. This is caused by increased hormones that slow down muscles in the digestive tract.  You may develop or continue to have constipation because increased hormones slow digestion and cause the muscles that push waste through your intestines to relax.  You may develop hemorrhoids. These are swollen veins (varicose veins) in the rectum that can itch or be painful.  You may develop swollen, bulging veins (varicose veins) in your legs.  You may have increased body aches in the pelvis, back, or thighs. This is due to weight gain and increased hormones that are relaxing your joints.  You may have changes in your hair. These can include thickening of your hair, rapid growth, and changes in texture. Some women also have hair loss during or after pregnancy, or hair that feels dry or thin. Your hair will most likely return to normal after your baby is born.  Your breasts will continue to grow and they will continue to become tender. A yellow fluid (colostrum) may leak from your breasts. This is the first milk you are producing for your baby.  Your belly button may stick out.  You may notice more swelling in your hands,  face, or ankles.  You may have increased tingling or numbness in your hands, arms, and legs. The skin on your belly may also feel numb.  You may feel short of breath because of your expanding uterus.  You may have more problems sleeping. This can be caused by the size of your belly, increased need to urinate, and an increase in your body's metabolism.  You may notice the fetus "dropping," or moving lower in your abdomen (lightening).  You may have increased vaginal discharge.  You may notice your joints feel loose and you may have pain around your pelvic bone.  What to expect at prenatal visits You will have prenatal exams every 2 weeks until week 36. Then you will have weekly prenatal exams. During a routine prenatal visit:  You will be weighed to make sure you and the baby are growing normally.  Your blood pressure will be taken.  Your abdomen will be measured to track your baby's growth.  The fetal heartbeat will be listened to.  Any test results from the previous visit will be discussed.  You may have a cervical check near your due date to see if your cervix has softened or thinned (effaced).  You will be tested for Group B streptococcus. This happens between 35 and 37 weeks.  Your health care provider may ask you:  What your birth plan is.  How you are feeling.  If you are feeling the baby move.  If you have had   any abnormal symptoms, such as leaking fluid, bleeding, severe headaches, or abdominal cramping.  If you are using any tobacco products, including cigarettes, chewing tobacco, and electronic cigarettes.  If you have any questions.  Other tests or screenings that may be performed during your third trimester include:  Blood tests that check for low iron levels (anemia).  Fetal testing to check the health, activity level, and growth of the fetus. Testing is done if you have certain medical conditions or if there are problems during the  pregnancy.  Nonstress test (NST). This test checks the health of your baby to make sure there are no signs of problems, such as the baby not getting enough oxygen. During this test, a belt is placed around your belly. The baby is made to move, and its heart rate is monitored during movement.  What is false labor? False labor is a condition in which you feel small, irregular tightenings of the muscles in the womb (contractions) that usually go away with rest, changing position, or drinking water. These are called Braxton Hicks contractions. Contractions may last for hours, days, or even weeks before true labor sets in. If contractions come at regular intervals, become more frequent, increase in intensity, or become painful, you should see your health care provider. What are the signs of labor?  Abdominal cramps.  Regular contractions that start at 10 minutes apart and become stronger and more frequent with time.  Contractions that start on the top of the uterus and spread down to the lower abdomen and back.  Increased pelvic pressure and dull back pain.  A watery or bloody mucus discharge that comes from the vagina.  Leaking of amniotic fluid. This is also known as your "water breaking." It could be a slow trickle or a gush. Let your health care provider know if it has a color or strange odor. If you have any of these signs, call your health care provider right away, even if it is before your due date. Follow these instructions at home: Medicines  Follow your health care provider's instructions regarding medicine use. Specific medicines may be either safe or unsafe to take during pregnancy.  Take a prenatal vitamin that contains at least 600 micrograms (mcg) of folic acid.  If you develop constipation, try taking a stool softener if your health care provider approves. Eating and drinking  Eat a balanced diet that includes fresh fruits and vegetables, whole grains, good sources of protein  such as meat, eggs, or tofu, and low-fat dairy. Your health care provider will help you determine the amount of weight gain that is right for you.  Avoid raw meat and uncooked cheese. These carry germs that can cause birth defects in the baby.  If you have low calcium intake from food, talk to your health care provider about whether you should take a daily calcium supplement.  Eat four or five small meals rather than three large meals a day.  Limit foods that are high in fat and processed sugars, such as fried and sweet foods.  To prevent constipation: ? Drink enough fluid to keep your urine clear or pale yellow. ? Eat foods that are high in fiber, such as fresh fruits and vegetables, whole grains, and beans. Activity  Exercise only as directed by your health care provider. Most women can continue their usual exercise routine during pregnancy. Try to exercise for 30 minutes at least 5 days a week. Stop exercising if you experience uterine contractions.  Avoid heavy   lifting.  Do not exercise in extreme heat or humidity, or at high altitudes.  Wear low-heel, comfortable shoes.  Practice good posture.  You may continue to have sex unless your health care provider tells you otherwise. Relieving pain and discomfort  Take frequent breaks and rest with your legs elevated if you have leg cramps or low back pain.  Take warm sitz baths to soothe any pain or discomfort caused by hemorrhoids. Use hemorrhoid cream if your health care provider approves.  Wear a good support bra to prevent discomfort from breast tenderness.  If you develop varicose veins: ? Wear support pantyhose or compression stockings as told by your healthcare provider. ? Elevate your feet for 15 minutes, 3-4 times a day. Prenatal care  Write down your questions. Take them to your prenatal visits.  Keep all your prenatal visits as told by your health care provider. This is important. Safety  Wear your seat belt at  all times when driving.  Make a list of emergency phone numbers, including numbers for family, friends, the hospital, and police and fire departments. General instructions  Avoid cat litter boxes and soil used by cats. These carry germs that can cause birth defects in the baby. If you have a cat, ask someone to clean the litter box for you.  Do not travel far distances unless it is absolutely necessary and only with the approval of your health care provider.  Do not use hot tubs, steam rooms, or saunas.  Do not drink alcohol.  Do not use any products that contain nicotine or tobacco, such as cigarettes and e-cigarettes. If you need help quitting, ask your health care provider.  Do not use any medicinal herbs or unprescribed drugs. These chemicals affect the formation and growth of the baby.  Do not douche or use tampons or scented sanitary pads.  Do not cross your legs for long periods of time.  To prepare for the arrival of your baby: ? Take prenatal classes to understand, practice, and ask questions about labor and delivery. ? Make a trial run to the hospital. ? Visit the hospital and tour the maternity area. ? Arrange for maternity or paternity leave through employers. ? Arrange for family and friends to take care of pets while you are in the hospital. ? Purchase a rear-facing car seat and make sure you know how to install it in your car. ? Pack your hospital bag. ? Prepare the baby's nursery. Make sure to remove all pillows and stuffed animals from the baby's crib to prevent suffocation.  Visit your dentist if you have not gone during your pregnancy. Use a soft toothbrush to brush your teeth and be gentle when you floss. Contact a health care provider if:  You are unsure if you are in labor or if your water has broken.  You become dizzy.  You have mild pelvic cramps, pelvic pressure, or nagging pain in your abdominal area.  You have lower back pain.  You have persistent  nausea, vomiting, or diarrhea.  You have an unusual or bad smelling vaginal discharge.  You have pain when you urinate. Get help right away if:  Your water breaks before 37 weeks.  You have regular contractions less than 5 minutes apart before 37 weeks.  You have a fever.  You are leaking fluid from your vagina.  You have spotting or bleeding from your vagina.  You have severe abdominal pain or cramping.  You have rapid weight loss or weight gain.    You have shortness of breath with chest pain.  You notice sudden or extreme swelling of your face, hands, ankles, feet, or legs.  Your baby makes fewer than 10 movements in 2 hours.  You have severe headaches that do not go away when you take medicine.  You have vision changes. Summary  The third trimester is from week 28 through week 40, months 7 through 9. The third trimester is a time when the unborn baby (fetus) is growing rapidly.  During the third trimester, your discomfort may increase as you and your baby continue to gain weight. You may have abdominal, leg, and back pain, sleeping problems, and an increased need to urinate.  During the third trimester your breasts will keep growing and they will continue to become tender. A yellow fluid (colostrum) may leak from your breasts. This is the first milk you are producing for your baby.  False labor is a condition in which you feel small, irregular tightenings of the muscles in the womb (contractions) that eventually go away. These are called Braxton Hicks contractions. Contractions may last for hours, days, or even weeks before true labor sets in.  Signs of labor can include: abdominal cramps; regular contractions that start at 10 minutes apart and become stronger and more frequent with time; watery or bloody mucus discharge that comes from the vagina; increased pelvic pressure and dull back pain; and leaking of amniotic fluid. This information is not intended to replace advice  given to you by your health care provider. Make sure you discuss any questions you have with your health care provider. Document Released: 06/05/2001 Document Revised: 11/17/2015 Document Reviewed: 08/12/2012 Elsevier Interactive Patient Education  2017 [employer]  

## 2016-09-26 NOTE — Progress Notes (Signed)
Fetal Surveillance Testing today:  EFW   High Risk Pregnancy Diagnosis(es):   2VC  [id number] [redacted]w[redacted]d Estimated Date of Delivery: 12/21/16  Blood pressure 110/70, pulse 78, weight 232 lb (105.2 kg), last menstrual period 02/27/2016.  Urinalysis: Positive for trace protein   HPI: The patient is being seen today for ongoing management of the above. Today she reports seeing a little pink yesterday when she wiped    BP weight and urine results all reviewed and noted. Patient reports good fetal movement, denies any bleeding and no rupture of membranes symptoms or regular contractions.  Edema:  no  Patient is without complaints other than noted in her HPI. All questions were answered.  All lab and sonogram results have been reviewed. Comments: normal [place] 27+5 wks,frank breech,post pl gr 0,normal ov's bilat,cx 4.3 cm,afi 16.6 cm,2vc, fhr 137 bpm,EFW 1173 g 57%  Assessment:  1.  Pregnancy at [redacted]w[redacted]d,  Estimated Date of Delivery: 12/21/16 :                         2.  2VC, normal growth                        3.    Medication(s) Plans:  Continue ASA   Treatment Plan:  EFW 28,36 week.  NST if any growth/US concerns  Return in about 4 weeks (around 10/24/2016) for HROB, US:EFW. for appointment for high risk OB care  No orders of the defined types were placed in this encounter.  Orders Placed This Encounter  Procedures  . US OB Follow Up  . POCT urinalysis dipstick

## 2016-09-27 ENCOUNTER — Other Ambulatory Visit: Payer: Self-pay | Admitting: Women's Health

## 2016-09-27 ENCOUNTER — Telehealth: Payer: Self-pay | Admitting: *Deleted

## 2016-09-27 DIAGNOSIS — O24410 Gestational diabetes mellitus in pregnancy, diet controlled: Secondary | ICD-10-CM

## 2016-09-27 DIAGNOSIS — O24419 Gestational diabetes mellitus in pregnancy, unspecified control: Secondary | ICD-10-CM | POA: Insufficient documentation

## 2016-09-27 LAB — CBC
Hematocrit: 36.8 % (ref 34.0–46.6)
Hemoglobin: 11.8 g/dL (ref 11.1–15.9)
MCH: 28.4 pg (ref 26.6–33.0)
MCHC: 32.1 g/dL (ref 31.5–35.7)
MCV: 89 fL (ref 79–97)
PLATELETS: 271 10*3/uL (ref 150–379)
RBC: 4.16 x10E6/uL (ref 3.77–5.28)
RDW: 14.2 % (ref 12.3–15.4)
WBC: 11.3 10*3/uL — AB (ref 3.4–10.8)

## 2016-09-27 LAB — AB SCR+ANTIBODY ID

## 2016-09-27 LAB — GLUCOSE TOLERANCE, 2 HOURS W/ 1HR
GLUCOSE, FASTING: 87 mg/dL (ref 65–91)
Glucose, 1 hour: 197 mg/dL — ABNORMAL HIGH (ref 65–179)
Glucose, 2 hour: 156 mg/dL — ABNORMAL HIGH (ref 65–152)

## 2016-09-27 LAB — RPR: RPR Ser Ql: NONREACTIVE

## 2016-09-27 LAB — ANTIBODY SCREEN

## 2016-09-27 LAB — HIV ANTIBODY (ROUTINE TESTING W REFLEX): HIV SCREEN 4TH GENERATION: NONREACTIVE

## 2016-09-27 NOTE — Telephone Encounter (Signed)
  LMOVM for patient to return call regarding diagnosis of GDM per Selena Batten.   Please let her know she has gestational diabetes. I have placed referral to dietician, she should get a call from them within the next week to schedule appointment. She will be checking blood sugars 4 times a day, bring log to each appointment.

## 2016-09-28 ENCOUNTER — Telehealth: Payer: Self-pay | Admitting: *Deleted

## 2016-09-28 NOTE — Telephone Encounter (Signed)
LMOVM to return call regarding Sherry Flores's message  "Please let her know she has gestational diabetes. I have placed referral to dietician, she should get a call from them within the next week to schedule appointment. She will be checking blood sugars 4 times a day, bring log to each appointment. "

## 2016-10-01 ENCOUNTER — Telehealth: Payer: Self-pay | Admitting: *Deleted

## 2016-10-01 NOTE — Telephone Encounter (Signed)
Informed patient of diagnosis of GDM. Patient states she spoke with the dietician and is scheduled for a class.  She will be checking blood sugars 4 times a day, bring log to each appointment. Patient verbalized understanding.

## 2016-10-10 ENCOUNTER — Encounter: Payer: 59 | Attending: Physician Assistant

## 2016-10-10 DIAGNOSIS — Z713 Dietary counseling and surveillance: Secondary | ICD-10-CM | POA: Insufficient documentation

## 2016-10-10 DIAGNOSIS — Z6839 Body mass index (BMI) 39.0-39.9, adult: Secondary | ICD-10-CM | POA: Insufficient documentation

## 2016-10-10 DIAGNOSIS — O24410 Gestational diabetes mellitus in pregnancy, diet controlled: Secondary | ICD-10-CM | POA: Diagnosis not present

## 2016-10-10 DIAGNOSIS — Z3A00 Weeks of gestation of pregnancy not specified: Secondary | ICD-10-CM | POA: Diagnosis not present

## 2016-10-16 NOTE — Progress Notes (Signed)
  Patient was seen on 10/10/2016 for Gestational Diabetes self-management . The following learning objectives were met by the patient :   States the definition of Gestational Diabetes  States why dietary management is important in controlling blood glucose  Describes the effects of carbohydrates on blood glucose levels  Demonstrates ability to create a balanced meal plan  Demonstrates carbohydrate counting   States when to check blood glucose levels  Demonstrates proper blood glucose monitoring techniques  States the effect of stress and exercise on blood glucose levels  States the importance of limiting caffeine and abstaining from alcohol and smoking  Plan:  Aim for 3 Carb Choices per meal (45 grams) +/- 1 either way  Aim for 1-2 Carbs per snack Begin reading food labels for Total Carbohydrate of foods Consider  increasing your activity level by walking or other activity daily as tolerated Begin checking BG before breakfast and 2 hours after first bite of breakfast, lunch and dinner as directed by MD  Take medication if directed by MD  Patient already has a meter: And is testing pre breakfast and 2 hours each meal as directed by MD Patient states: FBG: around 124 mg/dl, post meal @ 169 mg/dl Patient instructed to test pre breakfast and 2 hours each meal as directed by MD Bring Log Book to every medical appointment   Patient instructed to monitor glucose levels: FBS: 60 - <90 2 hour: <120   Patient received the following handouts:  Nutrition Diabetes and Pregnancy  Carbohydrate Counting List  Patient will be seen for follow-up as needed.

## 2016-10-24 ENCOUNTER — Encounter: Payer: Self-pay | Admitting: Advanced Practice Midwife

## 2016-10-24 ENCOUNTER — Ambulatory Visit (INDEPENDENT_AMBULATORY_CARE_PROVIDER_SITE_OTHER): Payer: 59

## 2016-10-24 ENCOUNTER — Ambulatory Visit (INDEPENDENT_AMBULATORY_CARE_PROVIDER_SITE_OTHER): Payer: 59 | Admitting: Advanced Practice Midwife

## 2016-10-24 VITALS — BP 110/80 | HR 80 | Wt 228.0 lb

## 2016-10-24 DIAGNOSIS — O0992 Supervision of high risk pregnancy, unspecified, second trimester: Secondary | ICD-10-CM

## 2016-10-24 DIAGNOSIS — Z3403 Encounter for supervision of normal first pregnancy, third trimester: Secondary | ICD-10-CM

## 2016-10-24 DIAGNOSIS — Z331 Pregnant state, incidental: Secondary | ICD-10-CM

## 2016-10-24 DIAGNOSIS — O09299 Supervision of pregnancy with other poor reproductive or obstetric history, unspecified trimester: Secondary | ICD-10-CM

## 2016-10-24 DIAGNOSIS — Z1389 Encounter for screening for other disorder: Secondary | ICD-10-CM

## 2016-10-24 DIAGNOSIS — O09899 Supervision of other high risk pregnancies, unspecified trimester: Secondary | ICD-10-CM

## 2016-10-24 DIAGNOSIS — Z98891 History of uterine scar from previous surgery: Secondary | ICD-10-CM

## 2016-10-24 DIAGNOSIS — O24419 Gestational diabetes mellitus in pregnancy, unspecified control: Secondary | ICD-10-CM

## 2016-10-24 DIAGNOSIS — Z8632 Personal history of gestational diabetes: Secondary | ICD-10-CM

## 2016-10-24 LAB — POCT URINALYSIS DIPSTICK
Blood, UA: NEGATIVE
GLUCOSE UA: NEGATIVE
Ketones, UA: NEGATIVE
Leukocytes, UA: NEGATIVE
Nitrite, UA: NEGATIVE
PROTEIN UA: NEGATIVE

## 2016-10-24 MED ORDER — GLYBURIDE 2.5 MG PO TABS: 2.5000 mg | ORAL_TABLET | Freq: Two times a day (BID) | ORAL | 3 refills | Status: DC

## 2016-10-24 NOTE — Progress Notes (Signed)
Fetal Surveillance Testing today:  [place]   High Risk Pregnancy Diagnosis(es):   A1DM, 2VC  [id number] [redacted]w[redacted]d Estimated Date of Delivery: 12/21/16  Blood pressure 110/80, pulse 80, weight 228 lb (103.4 kg), last menstrual period 02/27/2016.  Urinalysis: Negative   HPI: The patient is being seen today for ongoing management of the above. Today she reports FBS all > 100, as high as 118 and 2 hour BS 117-182.  Has actually lost 5#, so diet is most likely good   BP weight and urine results all reviewed and noted. Patient reports good fetal movement, denies any bleeding and no rupture of membranes symptoms or regular contractions.   Edema:  no  Patient is without complaints other than noted in her HPI. All questions were answered.  All lab and sonogram results have been reviewed.  Comments: normal [place] 31+5 wks,frank breech,pos pl gr 2,normal ovaries bilat,afi 15 cm,fhr 165 bpm,2 VC, efw 1899 g 53%,BPD 97%  Assessment:  1.  Pregnancy at [redacted]w[redacted]d,  Estimated Date of Delivery: 12/21/16 :                          2.  A1 Dm, poor control.    3. 2VC                        4.  Hx preclampsia  Medication(s) Plans:  Start Glyburide 2.5mg  BID  Continue ASA   Treatment Plan:  [place] -32-35-38/ Twice weekly NST  Return for Tues-Fri for NST (start next Tuesday)/HROB. for appointment for high risk OB care  Meds ordered this encounter  Medications  . glyBURIDE (DIABETA) 2.5 MG tablet    Sig: Take 1 tablet (2.5 mg total) by mouth 2 (two) times daily with a meal.    Dispense:  60 tablet    Refill:  3    Order Specific Question:   Supervising Provider    Answer:   Duane Lope H [[id number]]   Orders Placed This Encounter  Procedures  . POCT urinalysis dipstick

## 2016-10-24 NOTE — Progress Notes (Signed)
[place] 31+5 wks,frank breech,pos pl gr 2,normal ovaries bilat,afi 15 cm,fhr 165 bpm,2 VC, efw 1899 g 53%,BPD 97%

## 2016-10-30 ENCOUNTER — Encounter: Payer: Self-pay | Admitting: Advanced Practice Midwife

## 2016-10-30 ENCOUNTER — Ambulatory Visit (INDEPENDENT_AMBULATORY_CARE_PROVIDER_SITE_OTHER): Payer: 59 | Admitting: Advanced Practice Midwife

## 2016-10-30 VITALS — BP 110/80 | HR 76 | Wt 229.0 lb

## 2016-10-30 DIAGNOSIS — O0993 Supervision of high risk pregnancy, unspecified, third trimester: Secondary | ICD-10-CM | POA: Diagnosis not present

## 2016-10-30 DIAGNOSIS — Z1389 Encounter for screening for other disorder: Secondary | ICD-10-CM

## 2016-10-30 DIAGNOSIS — O09899 Supervision of other high risk pregnancies, unspecified trimester: Secondary | ICD-10-CM

## 2016-10-30 DIAGNOSIS — O24419 Gestational diabetes mellitus in pregnancy, unspecified control: Secondary | ICD-10-CM

## 2016-10-30 DIAGNOSIS — Z331 Pregnant state, incidental: Secondary | ICD-10-CM

## 2016-10-30 LAB — POCT URINALYSIS DIPSTICK
Blood, UA: NEGATIVE
Glucose, UA: NEGATIVE
KETONES UA: NEGATIVE
LEUKOCYTES UA: NEGATIVE
Nitrite, UA: NEGATIVE
PROTEIN UA: NEGATIVE

## 2016-10-30 NOTE — Patient Instructions (Signed)
Increase evening dose of medication to 5mg  (two pills) and morning dose will stay the same (one tablet)

## 2016-10-30 NOTE — Progress Notes (Signed)
Fetal Surveillance Testing today:  NST   High Risk Pregnancy Diagnosis(es):   A2DM, 2vc HPI: The patient is being seen today for ongoing management of the above. She was started on Glyburide 2/5mg  BID last week Today she reports FBS 100-123 and 2 hour pp usually <120.  (didn't bring log)   BP weight and urine results all reviewed and noted. Patient reports good fetal movement, denies any bleeding and no rupture of membranes symptoms or regular contractions.   Fetal Heart rate:  140 Edema:  no  Patient is without complaints other than noted in her HPI. All questions were answered.  All lab and sonogram results have been reviewed. Comments: NST reactive  Assessment:  1.  Pregnancy at 4727w4d,  Estimated Date of Delivery: 12/21/16 :                          2.  A2DM, poor am control                        3.  2VC  Medication(s) Plans:  Glyburide 2.5 mg am and 5 mg in pm   Treatment Plan:   US -32-35-38/ Twice weekly NST RLTCS 39 weeks  Return for Fri0Tues NST/HROB. for appointment for high risk OB care  No orders of the defined types were placed in this encounter.  Orders Placed This Encounter  Procedures  . POCT urinalysis dipstick

## 2016-11-02 ENCOUNTER — Inpatient Hospital Stay (HOSPITAL_COMMUNITY)
Admission: AD | Admit: 2016-11-02 | Discharge: 2016-11-02 | Disposition: A | Discharge status: Home / Self Care | Payer: 59 | Source: Ambulatory Visit | Attending: Family Medicine | Admitting: Family Medicine

## 2016-11-02 ENCOUNTER — Encounter (HOSPITAL_COMMUNITY): Payer: Self-pay | Admitting: *Deleted

## 2016-11-02 ENCOUNTER — Ambulatory Visit (INDEPENDENT_AMBULATORY_CARE_PROVIDER_SITE_OTHER): Payer: 59 | Admitting: Women's Health

## 2016-11-02 ENCOUNTER — Encounter: Payer: Self-pay | Admitting: Women's Health

## 2016-11-02 VITALS — BP 118/74 | HR 72 | Wt 230.0 lb

## 2016-11-02 DIAGNOSIS — Z87891 Personal history of nicotine dependence: Secondary | ICD-10-CM | POA: Insufficient documentation

## 2016-11-02 DIAGNOSIS — Z8759 Personal history of other complications of pregnancy, childbirth and the puerperium: Secondary | ICD-10-CM

## 2016-11-02 DIAGNOSIS — O24419 Gestational diabetes mellitus in pregnancy, unspecified control: Secondary | ICD-10-CM

## 2016-11-02 DIAGNOSIS — Z79899 Other long term (current) drug therapy: Secondary | ICD-10-CM | POA: Insufficient documentation

## 2016-11-02 DIAGNOSIS — Z331 Pregnant state, incidental: Secondary | ICD-10-CM

## 2016-11-02 DIAGNOSIS — Z8632 Personal history of gestational diabetes: Secondary | ICD-10-CM

## 2016-11-02 DIAGNOSIS — Z3689 Encounter for other specified antenatal screening: Secondary | ICD-10-CM

## 2016-11-02 DIAGNOSIS — Z3A33 33 weeks gestation of pregnancy: Secondary | ICD-10-CM | POA: Diagnosis not present

## 2016-11-02 DIAGNOSIS — Z7982 Long term (current) use of aspirin: Secondary | ICD-10-CM | POA: Diagnosis not present

## 2016-11-02 DIAGNOSIS — Z98891 History of uterine scar from previous surgery: Secondary | ICD-10-CM

## 2016-11-02 DIAGNOSIS — O0990 Supervision of high risk pregnancy, unspecified, unspecified trimester: Secondary | ICD-10-CM

## 2016-11-02 DIAGNOSIS — Z1389 Encounter for screening for other disorder: Secondary | ICD-10-CM

## 2016-11-02 DIAGNOSIS — O09893 Supervision of other high risk pregnancies, third trimester: Secondary | ICD-10-CM

## 2016-11-02 DIAGNOSIS — O09299 Supervision of pregnancy with other poor reproductive or obstetric history, unspecified trimester: Secondary | ICD-10-CM

## 2016-11-02 DIAGNOSIS — O0993 Supervision of high risk pregnancy, unspecified, third trimester: Secondary | ICD-10-CM

## 2016-11-02 DIAGNOSIS — O09899 Supervision of other high risk pregnancies, unspecified trimester: Secondary | ICD-10-CM

## 2016-11-02 LAB — POCT URINALYSIS DIPSTICK
GLUCOSE UA: NEGATIVE
Ketones, UA: NEGATIVE
LEUKOCYTES UA: NEGATIVE
NITRITE UA: NEGATIVE
Protein, UA: NEGATIVE

## 2016-11-02 NOTE — MAU Provider Note (Signed)
History     CSN: [id number]  Arrival date and time: 11/02/16 1036   First Provider Initiated Contact with Patient 11/02/16 1104      Chief Complaint  Patient presents with  . Non-stress Test   HPI Ms. Sherry Flores is a 36 y.o. G3P2002 at [redacted]w[redacted]d who presents to [facility]U today from [facility] for prolonged EFM due to a prolonged fetal deceleration noted in the office during routine testing. The patient as A2DM and a 2 VC. She reports normal fetal movement and denies vaginal bleeding, LOF or contractions today.   OB History    Gravida Para Term Preterm AB Living   3 2 2     2    SAB TAB Ectopic Multiple Live Births           2      Past Medical History:  Diagnosis Date  . Gestational diabetes   . Pre-eclampsia   . UTI (lower urinary tract infection)     Past Surgical History:  Procedure Laterality Date  . CESAREAN SECTION      Family History  Problem Relation Age of Onset  . Aneurysm Paternal Grandfather        brain  . Dementia Maternal Grandfather   . Diabetes Mother     Social History  Substance Use Topics  . Smoking status: Former Smoker    Years: 2.00    Types: Cigarettes  . Smokeless tobacco: Former Neurosurgeon  . Alcohol use No    Allergies: No Known Allergies  Prescriptions Prior to Admission  Medication Sig Dispense Refill Last Dose  . aspirin EC 81 MG tablet Take 1 tablet (81 mg total) by mouth daily. 30 tablet 6 11/02/2016 at Unknown time  . glyBURIDE (DIABETA) 2.5 MG tablet Take 1 tablet (2.5 mg total) by mouth 2 (two) times daily with a meal. (Patient taking differently: Take 2.5 mg by mouth 2 (two) times daily with a meal. 2.5 in am and 5mg  at night) 60 tablet 3 11/02/2016 at Unknown time  . Prenat-FeFmCb-DSS-FA-DHA w/o A (CITRANATAL HARMONY) 27-1-260 MG CAPS Take daily 60 capsule 11 11/02/2016 at Unknown time    Review of Systems  Constitutional: Negative for fever.  Gastrointestinal: Negative for abdominal pain, constipation, diarrhea, nausea and  vomiting.  Genitourinary: Negative for vaginal bleeding and vaginal discharge.   Physical Exam   Blood pressure 132/72, pulse 76, last menstrual period 02/27/2016.  Physical Exam  Nursing note and vitals reviewed. Constitutional: She is oriented to person, place, and time. She appears well-developed and well-nourished. No distress.  HENT:  Head: Normocephalic and atraumatic.  Cardiovascular: Normal rate.   Respiratory: Effort normal.  GI: Soft. She exhibits no distension and no mass. There is no tenderness. There is no rebound and no guarding.  Neurological: She is alert and oriented to person, place, and time.  Skin: Skin is warm and dry. No erythema.  Psychiatric: She has a normal mood and affect.    Results for orders placed or performed in visit on 11/02/16 (from the past 24 hour(s))  POCT urinalysis dipstick     Status: None   Collection Time: 11/02/16  9:08 AM  Result Value Ref Range   Color, UA     Clarity, UA     Glucose, UA neg    Bilirubin, UA     Ketones, UA neg    Spec Grav, UA  1.010 - 1.025   Blood, UA trace    pH, UA  5.0 - 8.0  Protein, UA neg    Urobilinogen, UA  0.2 or 1.0 E.U./dL   Nitrite, UA neg    Leukocytes, UA Negative Negative   Fetal Monitoring: Baseline: 125 bpm Variability: moderate Accelerations: 15 x 15 Decelerations: none Contractions: none  MAU Course  Procedures None  MDM EFM placed Reactive tracing. Discussed with Dr. Shawnie PonsPratt. Ok for discharge after 2 hours of EFM.   Assessment and Plan  A: SIUP at 3733w0d A2DM Reactive NST  P:  Discharge home Preterm labor precautions and kick counts discussed [facility] care or sooner PRN Patient may return to MAU as needed or if her condition were to change or worsen  Vonzella NippleJulie Fatuma Dowers, PA-C 11/02/2016, 12:55 PM

## 2016-11-02 NOTE — MAU Note (Addendum)
Sent from OB's office for prolonged monitoring; having NST biweekly for gestational diabetes; prolonged decel  today during monitoring;

## 2016-11-02 NOTE — Patient Instructions (Signed)
Call the office ([phone]) or go to [facility] if:  You begin to have strong, frequent contractions  Your water breaks.  Sometimes it is a big gush of fluid, sometimes it is just a trickle that keeps getting your panties wet or running down your legs  You have vaginal bleeding.  It is normal to have a small amount of spotting if your cervix was checked.   You don't feel your baby moving like normal.  If you don't, get you something to eat and drink and lay down and focus on feeling your baby move.  You should feel at least 10 movements in 2 hours.  If you don't, you should call the office or go to [facility].    Tdap Vaccine  It is recommended that you get the Tdap vaccine during the third trimester of EACH pregnancy to help protect your baby from getting pertussis (whooping cough)  27-36 weeks is the BEST time to do this so that you can pass the protection on to your baby. During pregnancy is better than after pregnancy, but if you are unable to get it during pregnancy it will be offered at the hospital.   You can get this vaccine at the health department or your family doctor  Everyone who will be around your baby should also be up-to-date on their vaccines. Adults (who are not pregnant) only need 1 dose of Tdap during adulthood.    Preterm Labor and Birth Information The normal length of a pregnancy is 39-41 weeks. Preterm labor is when labor starts before 37 completed weeks of pregnancy. What are the risk factors for preterm labor? Preterm labor is more likely to occur in women who:  Have certain infections during pregnancy such as a bladder infection, sexually transmitted infection, or infection inside the uterus (chorioamnionitis).  Have a shorter-than-normal cervix.  Have gone into preterm labor before.  Have had surgery on their cervix.  Are younger than age 17 or older than age 35.  Are African American.  Are pregnant with twins or multiple babies (multiple  gestation).  Take street drugs or smoke while pregnant.  Do not gain enough weight while pregnant.  Became pregnant shortly after having been pregnant. What are the symptoms of preterm labor? Symptoms of preterm labor include:  Cramps similar to those that can happen during a menstrual period. The cramps may happen with diarrhea.  Pain in the abdomen or lower back.  Regular uterine contractions that may feel like tightening of the abdomen.  A feeling of increased pressure in the pelvis.  Increased watery or bloody mucus discharge from the vagina.  Water breaking (ruptured amniotic sac). Why is it important to recognize signs of preterm labor? It is important to recognize signs of preterm labor because babies who are born prematurely may not be fully developed. This can put them at an increased risk for:  Long-term (chronic) heart and lung problems.  Difficulty immediately after birth with regulating body systems, including blood sugar, body temperature, heart rate, and breathing rate.  Bleeding in the brain.  Cerebral palsy.  Learning difficulties.  Death. These risks are highest for babies who are born before 34 weeks of pregnancy. How is preterm labor treated? Treatment depends on the length of your pregnancy, your condition, and the health of your baby. It may involve:  Having a stitch (suture) placed in your cervix to prevent your cervix from opening too early (cerclage).  Taking or being given medicines, such as:  Hormone   medicines. These may be given early in pregnancy to help support the pregnancy.  Medicine to stop contractions.  Medicines to help mature the baby's lungs. These may be prescribed if the risk of delivery is high.  Medicines to prevent your baby from developing cerebral palsy. If the labor happens before 34 weeks of pregnancy, you may need to stay in the hospital. What should I do if I think I am in preterm labor? If you think that you are  going into preterm labor, call your health care provider right away. How can I prevent preterm labor in future pregnancies? To increase your chance of having a full-term pregnancy:  Do not use any tobacco products, such as cigarettes, chewing tobacco, and e-cigarettes. If you need help quitting, ask your health care provider.  Do not use street drugs or medicines that have not been prescribed to you during your pregnancy.  Talk with your health care provider before taking any herbal supplements, even if you have been taking them regularly.  Make sure you gain a healthy amount of weight during your pregnancy.  Watch for infection. If you think that you might have an infection, get it checked right away.  Make sure to tell your health care provider if you have gone into preterm labor before. This information is not intended to replace advice given to you by your health care provider. Make sure you discuss any questions you have with your health care provider. Document Released: 09/01/2003 Document Revised: 11/22/2015 Document Reviewed: 11/02/2015 Elsevier Interactive Patient Education  2017 Else[employer] Inc[employer]     

## 2016-11-02 NOTE — Progress Notes (Signed)
High Risk Pregnancy Diagnosis(es): A2DM, 2VC [id number] 7935w0d Estimated Date of Delivery: 12/21/16 LMP 02/27/2016 (Exact Date)   Urinalysis: Positive for tr blood HPI:  fbs all > 90 (92-116) but definitely trending down from previous, 2hr pp 105-130 (3 >120) Glyburide was just increased to 2.5AM/5PM 3 days ago BP, weight, and urine reviewed.  Reports good fm. Denies regular uc's, lof, vb, uti s/s. No complaints.   Fundal Height:  33  Fetal Heart rate:  135, moderate variability w/ 15x15 accels, then 5min prolonged decel to nadir of 30 briefly- pt states baby was moving during this- likely got on cord- resolved spontaneously. Post decel baseline 140, moderate variability, 15x15 accels.  D[facility] w/ [facility] our [facility]s not in office today) will sent to [facility] for prolonged monitoring. Also notified Vonzella NippleJulie Wenzel, PA in [facility] Edema:  none  Reviewed ptl s/s, fkc. Recommended Tdap at HD/PCP per CDC guidelines.  All questions were answered Assessment: 6835w0d A2DM, 2VC Medication(s) Plans:  Glyburide 2.5mg  AM/5mg  PM, baby asa Treatment Plan:  Growth u/s @ 35, 38wks     2x/wk testing    Deliver @ 39wks Follow up on Tues for high-risk OB appt and NST

## 2016-11-02 NOTE — Discharge Instructions (Signed)
Fetal Movement Counts Patient Name: ________________________________________________ Patient Due Date: ____________________ What is a fetal movement count? A fetal movement count is the number of times that you feel your baby move during a certain amount of time. This may also be called a fetal kick count. A fetal movement count is recommended for every pregnant woman. You may be asked to start counting fetal movements as early as week 28 of your pregnancy. Pay attention to when your baby is most active. You may notice your baby's sleep and wake cycles. You may also notice things that make your baby move more. You should do a fetal movement count:  When your baby is normally most active.  At the same time each day. A good time to count movements is while you are resting, after having something to eat and drink. How do I count fetal movements? 1. Find a quiet, comfortable area. Sit, or lie down on your side. 2. Write down the date, the start time and stop time, and the number of movements that you felt between those two times. Take this information with you to your health care visits. 3. For 2 hours, count kicks, flutters, swishes, rolls, and jabs. You should feel at least 10 movements during 2 hours. 4. You may stop counting after you have felt 10 movements. 5. If you do not feel 10 movements in 2 hours, have something to eat and drink. Then, keep resting and counting for 1 hour. If you feel at least 4 movements during that hour, you may stop counting. Contact a health care provider if:  You feel fewer than 4 movements in 2 hours.  Your baby is not moving like he or she usually does. Date: ____________ Start time: ____________ Stop time: ____________ Movements: ____________ Date: ____________ Start time: ____________ Stop time: ____________ Movements: ____________ Date: ____________ Start time: ____________ Stop time: ____________ Movements: ____________ Date: ____________ Start time:  ____________ Stop time: ____________ Movements: ____________ Date: ____________ Start time: ____________ Stop time: ____________ Movements: ____________ Date: ____________ Start time: ____________ Stop time: ____________ Movements: ____________ Date: ____________ Start time: ____________ Stop time: ____________ Movements: ____________ Date: ____________ Start time: ____________ Stop time: ____________ Movements: ____________ Date: ____________ Start time: ____________ Stop time: ____________ Movements: ____________ This information is not intended to replace advice given to you by your health care provider. Make sure you discuss any questions you have with your health care provider. Document Released: 07/11/2006 Document Revised: 02/08/2016 Document Reviewed: 07/21/2015 Elsevier Interactive Patient Education  2017 [employer] Braxton Hicks Contractions Contractions of the uterus can occur throughout pregnancy, but they are not always a sign that you are in labor. You may have practice contractions called Braxton Hicks contractions. These false labor contractions are sometimes confused with true labor. What are Braxton Hicks contractions? Braxton Hicks contractions are tightening movements that occur in the muscles of the uterus before labor. Unlike true labor contractions, these contractions do not result in opening (dilation) and thinning of the cervix. Toward the end of pregnancy (32-34 weeks), Braxton Hicks contractions can happen more often and may become stronger. These contractions are sometimes difficult to tell apart from true labor because they can be very uncomfortable. You should not feel embarrassed if you go to the hospital with false labor. Sometimes, the only way to tell if you are in true labor is for your health care provider to look for changes in the cervix. The health care provider will do a physical exam and may monitor your contractions. If you   are not in true labor, the exam  should show that your cervix is not dilating and your water has not broken. If there are no prenatal problems or other health problems associated with your pregnancy, it is completely safe for you to be sent home with false labor. You may continue to have Braxton Hicks contractions until you go into true labor. How can I tell the difference between true labor and false labor?  Differences  False labor  Contractions last 30-70 seconds.: Contractions are usually shorter and not as strong as true labor contractions.  Contractions become very regular.: Contractions are usually irregular.  Discomfort is usually felt in the top of the uterus, and it spreads to the lower abdomen and low back.: Contractions are often felt in the front of the lower abdomen and in the groin.  Contractions do not go away with walking.: Contractions may go away when you walk around or change positions while lying down.  Contractions usually become more intense and increase in frequency.: Contractions get weaker and are shorter-lasting as time goes on.  The cervix dilates and gets thinner.: The cervix usually does not dilate or become thin. Follow these instructions at home:  Take over-the-counter and prescription medicines only as told by your health care provider.  Keep up with your usual exercises and follow other instructions from your health care provider.  Eat and drink lightly if you think you are going into labor.  If Braxton Hicks contractions are making you uncomfortable:  Change your position from lying down or resting to walking, or change from walking to resting.  Sit and rest in a tub of warm water.  Drink enough fluid to keep your urine clear or pale yellow. Dehydration may cause these contractions.  Do slow and deep breathing several times an hour.  Keep all follow-up prenatal visits as told by your health care provider. This is important. Contact a health care provider if:  You have a  fever.  You have continuous pain in your abdomen. Get help right away if:  Your contractions become stronger, more regular, and closer together.  You have fluid leaking or gushing from your vagina.  You pass blood-tinged mucus (bloody show).  You have bleeding from your vagina.  You have low back pain that you never had before.  You feel your baby's head pushing down and causing pelvic pressure.  Your baby is not moving inside you as much as it used to. Summary  Contractions that occur before labor are called Braxton Hicks contractions, false labor, or practice contractions.  Braxton Hicks contractions are usually shorter, weaker, farther apart, and less regular than true labor contractions. True labor contractions usually become progressively stronger and regular and they become more frequent.  Manage discomfort from Braxton Hicks contractions by changing position, resting in a warm bath, drinking plenty of water, or practicing deep breathing. This information is not intended to replace advice given to you by your health care provider. Make sure you discuss any questions you have with your health care provider. Document Released: 06/11/2005 Document Revised: 04/30/2016 Document Reviewed: 04/30/2016 Elsevier Interactive Patient Education  2017 Else[employer] Inc.  

## 2016-11-06 ENCOUNTER — Ambulatory Visit (INDEPENDENT_AMBULATORY_CARE_PROVIDER_SITE_OTHER): Payer: 59 | Admitting: Obstetrics & Gynecology

## 2016-11-06 ENCOUNTER — Encounter: Payer: Self-pay | Admitting: Obstetrics & Gynecology

## 2016-11-06 VITALS — BP 116/64 | HR 62 | Wt 229.0 lb

## 2016-11-06 DIAGNOSIS — O0993 Supervision of high risk pregnancy, unspecified, third trimester: Secondary | ICD-10-CM

## 2016-11-06 DIAGNOSIS — O24419 Gestational diabetes mellitus in pregnancy, unspecified control: Secondary | ICD-10-CM

## 2016-11-06 DIAGNOSIS — Z331 Pregnant state, incidental: Secondary | ICD-10-CM

## 2016-11-06 DIAGNOSIS — Z1389 Encounter for screening for other disorder: Secondary | ICD-10-CM

## 2016-11-06 DIAGNOSIS — O09899 Supervision of other high risk pregnancies, unspecified trimester: Secondary | ICD-10-CM

## 2016-11-06 LAB — POCT URINALYSIS DIPSTICK
Glucose, UA: NEGATIVE
KETONES UA: NEGATIVE
LEUKOCYTES UA: NEGATIVE
NITRITE UA: NEGATIVE
PROTEIN UA: NEGATIVE

## 2016-11-06 MED ORDER — GLYBURIDE 2.5 MG PO TABS: ORAL_TABLET | ORAL | 1 refills | Status: DC

## 2016-11-06 NOTE — Progress Notes (Signed)
Fetal Surveillance Testing today:  Reactive NST   High Risk Pregnancy Diagnosis(es):   Class A2 DM, single umbilical artery  G3P2002 6883w4d Estimated Date of Delivery: 12/21/16  Blood pressure 116/64, pulse 62, weight 229 lb (103.9 kg), last menstrual period 02/27/2016.  Urinalysis: Negative   HPI: The patient is being seen today for ongoing management of as above. Today she reports morning fastings are high, the remainder are ok   BP weight and urine results all reviewed and noted. Patient reports good fetal movement, denies any bleeding and no rupture of membranes symptoms or regular contractions.  Fundal Height:  36 Fetal Heart rate:  135 Edema:  trace  Patient is without complaints other than noted in her HPI. All questions were answered.  All lab and sonogram results have been reviewed. Comments:    Assessment:  1.  Pregnancy at 3683w4d,  Estimated Date of Delivery: 12/21/16 :                          2.  Class A2 DM, suboptimal control                        3.  Single umbilical artery  Medication(s) Plans:  Glyburide 2.5 mg am, 5 mg pm + baby ASA  Treatment Plan:  Increase pm glyburide 7.5 mg with supper around 7 pm  Return in about 3 days (around 11/09/2016) for HROB, NST. for appointment for high risk OB care  Meds ordered this encounter  Medications  . glyBURIDE (DIABETA) 2.5 MG tablet    Sig: 1 tablet in am, and 3 tablets in the evening    Dispense:  120 tablet    Refill:  1   Orders Placed This Encounter  Procedures  . POCT urinalysis dipstick

## 2016-11-09 ENCOUNTER — Encounter: Payer: Self-pay | Admitting: Obstetrics and Gynecology

## 2016-11-09 ENCOUNTER — Ambulatory Visit (INDEPENDENT_AMBULATORY_CARE_PROVIDER_SITE_OTHER): Payer: 59 | Admitting: Obstetrics and Gynecology

## 2016-11-09 VITALS — BP 140/86 | HR 68 | Wt 228.0 lb

## 2016-11-09 DIAGNOSIS — Z331 Pregnant state, incidental: Secondary | ICD-10-CM

## 2016-11-09 DIAGNOSIS — Z1389 Encounter for screening for other disorder: Secondary | ICD-10-CM

## 2016-11-09 DIAGNOSIS — O0993 Supervision of high risk pregnancy, unspecified, third trimester: Secondary | ICD-10-CM

## 2016-11-09 LAB — POCT URINALYSIS DIPSTICK
GLUCOSE UA: NEGATIVE
Leukocytes, UA: NEGATIVE
Nitrite, UA: NEGATIVE
PROTEIN UA: NEGATIVE

## 2016-11-09 NOTE — Progress Notes (Signed)
Sherry CovertJessica L Flores is a 36 y.o. female  High Risk Pregnancy HROB Diagnosis(es):  Class A2 DM, single umbilical artery  G3P2002 5727w0d Estimated Date of Delivery: 12/21/16    HPI: The patient is being seen today for ongoing management of the above. Today she reports an episode of mild diaphoresis and lightheadedness ~0300 this AM that has resolved at this time. Patient states her blood sugar is ~100 in the morning. She checks blood sugar 4 times a day and is seeing numbers between 120 and high 130s. She has been complaint with her DM meds. Patient reports good fetal movement; denies any bleeding and no rupture of membranes symptoms or regular contractions.   BP weight and urine results reviewed and noted. Blood pressure 140/86, pulse 68, weight 228 lb (103.4 kg), last menstrual period 02/27/2016.  Fundal Height: 35 cm  Fetal Heart rate:  140 bpm Physical Examination: Abdomen - soft, nontender, nondistended, no masses or organomegaly                                     Pelvic - examination not indicated                                      Urinalysis:  POSITIVE for trace blood and small ketones                  Fetal Surveillance Testing today:  Reactive NST  Lab and sonogram results have been reviewed. Comments: normal  Discussed post-delivery birth control. Pt would like a tubal ligation following delivery.    Assessment:  1.  Pregnancy at 9027w0d,  G3P2002                         2.  Class A2 DM                        3.  Prior C-section for repeat                        4  Single umbilical artery with normal growth Medication(s) Plans:  Glyburide 5 mg BID    Treatment Plan: Increased Glyburide to 10 mg and continue with NST twice weekly. C-section scheduled for 8:30 AM on 11/13/2016.  Return in 4 days for appointment for high risk OB care and NST.    By signing my name below, I, Freida BusmanDiana Omoyeni, attest that this documentation has been prepared under the direction and in the presence  of Tilda BurrowJohn V Ferguson, MD . Electronically Signed: Freida Busmaniana Omoyeni, Scribe. 11/09/2016. 10:49 AM. I personally performed the services described in this documentation, which was SCRIBED in my presence. The recorded information has been reviewed and considered accurate. It has been edited as necessary during review. Tilda BurrowFERGUSON,JOHN V, MD

## 2016-11-13 ENCOUNTER — Ambulatory Visit (INDEPENDENT_AMBULATORY_CARE_PROVIDER_SITE_OTHER): Payer: 59 | Admitting: Obstetrics & Gynecology

## 2016-11-13 ENCOUNTER — Encounter: Payer: Self-pay | Admitting: Obstetrics & Gynecology

## 2016-11-13 VITALS — BP 112/74 | HR 68 | Wt 228.0 lb

## 2016-11-13 DIAGNOSIS — Z1389 Encounter for screening for other disorder: Secondary | ICD-10-CM

## 2016-11-13 DIAGNOSIS — O24419 Gestational diabetes mellitus in pregnancy, unspecified control: Secondary | ICD-10-CM

## 2016-11-13 DIAGNOSIS — Z331 Pregnant state, incidental: Secondary | ICD-10-CM

## 2016-11-13 DIAGNOSIS — O0993 Supervision of high risk pregnancy, unspecified, third trimester: Secondary | ICD-10-CM

## 2016-11-13 DIAGNOSIS — O09899 Supervision of other high risk pregnancies, unspecified trimester: Secondary | ICD-10-CM

## 2016-11-13 LAB — POCT URINALYSIS DIPSTICK
GLUCOSE UA: NEGATIVE
Ketones, UA: NEGATIVE
Leukocytes, UA: NEGATIVE
Nitrite, UA: NEGATIVE
Protein, UA: NEGATIVE

## 2016-11-13 MED ORDER — INSULIN SYRINGES (DISPOSABLE) U-100 0.3 ML MISC: 10.0000 [IU] | Freq: Every day | 0 refills | Status: DC

## 2016-11-13 MED ORDER — INSULIN NPH (HUMAN) (ISOPHANE) 100 UNIT/ML ~~LOC~~ SUSP: 10.0000 [IU] | Freq: Every day | SUBCUTANEOUS | 3 refills | Status: DC

## 2016-11-13 NOTE — Progress Notes (Signed)
Fetal Surveillance Testing today:  Reactive NST   High Risk Pregnancy Diagnosis(es):   Class A2 DM, single umbilical artery  G3P2002 3323w4d Estimated Date of Delivery: 12/21/16  Blood pressure 112/74, pulse 68, weight 228 lb (103.4 kg), last menstrual period 02/27/2016.  Urinalysis: Negative   HPI: The patient is being seen today for ongoing management of as above. Today she reports am fasting CBG still elevated   BP weight and urine results all reviewed and noted. Patient reports good fetal movement, denies any bleeding and no rupture of membranes symptoms or regular contractions.  Fundal Height:  36 Fetal Heart rate:  135 Edema:  trace  Patient is without complaints other than noted in her HPI. All questions were answered.  All lab and sonogram results have been reviewed. Comments:    Assessment:  1.  Pregnancy at 8923w4d,  Estimated Date of Delivery: 12/21/16 :                          2.  Class A2 DM                        3.  Single umbilical artery  Medication(s) Plans:  Continue glyburide 5 BID, add qhs NPH 10 units  Treatment Plan:  Continue twice weekly surveillance, EFW 36-37 weeks  Return in about 3 days (around 11/16/2016) for NST, HROB, with Dr Despina HiddenEure. for appointment for high risk OB care  Meds ordered this encounter  Medications  . insulin NPH Human (HUMULIN N,NOVOLIN N) 100 UNIT/ML injection    Sig: Inject 0.1 mLs (10 Units total) into the skin at bedtime.    Dispense:  10 mL    Refill:  3  . Insulin Syringes, Disposable, U-100 0.3 ML MISC    Sig: 10 Units by Does not apply route at bedtime.    Dispense:  100 each    Refill:  0   Orders Placed This Encounter  Procedures  . POCT urinalysis dipstick

## 2016-11-16 ENCOUNTER — Ambulatory Visit (INDEPENDENT_AMBULATORY_CARE_PROVIDER_SITE_OTHER): Payer: 59 | Admitting: Obstetrics & Gynecology

## 2016-11-16 ENCOUNTER — Encounter: Payer: Self-pay | Admitting: Obstetrics & Gynecology

## 2016-11-16 VITALS — BP 124/74 | HR 67 | Wt 229.0 lb

## 2016-11-16 DIAGNOSIS — O24419 Gestational diabetes mellitus in pregnancy, unspecified control: Secondary | ICD-10-CM | POA: Diagnosis not present

## 2016-11-16 DIAGNOSIS — Z1389 Encounter for screening for other disorder: Secondary | ICD-10-CM

## 2016-11-16 DIAGNOSIS — O09899 Supervision of other high risk pregnancies, unspecified trimester: Secondary | ICD-10-CM

## 2016-11-16 DIAGNOSIS — O0993 Supervision of high risk pregnancy, unspecified, third trimester: Secondary | ICD-10-CM

## 2016-11-16 DIAGNOSIS — Z331 Pregnant state, incidental: Secondary | ICD-10-CM

## 2016-11-16 DIAGNOSIS — Z8759 Personal history of other complications of pregnancy, childbirth and the puerperium: Secondary | ICD-10-CM

## 2016-11-16 DIAGNOSIS — Z98891 History of uterine scar from previous surgery: Secondary | ICD-10-CM

## 2016-11-16 LAB — POCT URINALYSIS DIPSTICK
Glucose, UA: NEGATIVE
Ketones, UA: NEGATIVE
Leukocytes, UA: NEGATIVE
Nitrite, UA: NEGATIVE
Protein, UA: NEGATIVE

## 2016-11-21 ENCOUNTER — Ambulatory Visit (INDEPENDENT_AMBULATORY_CARE_PROVIDER_SITE_OTHER): Payer: 59 | Admitting: Obstetrics & Gynecology

## 2016-11-21 ENCOUNTER — Encounter: Payer: Self-pay | Admitting: Obstetrics & Gynecology

## 2016-11-21 VITALS — BP 120/76 | HR 72 | Wt 230.0 lb

## 2016-11-21 DIAGNOSIS — O09893 Supervision of other high risk pregnancies, third trimester: Secondary | ICD-10-CM | POA: Diagnosis not present

## 2016-11-21 DIAGNOSIS — O09899 Supervision of other high risk pregnancies, unspecified trimester: Secondary | ICD-10-CM

## 2016-11-21 DIAGNOSIS — Z1389 Encounter for screening for other disorder: Secondary | ICD-10-CM

## 2016-11-21 DIAGNOSIS — Z98891 History of uterine scar from previous surgery: Secondary | ICD-10-CM

## 2016-11-21 DIAGNOSIS — Z8759 Personal history of other complications of pregnancy, childbirth and the puerperium: Secondary | ICD-10-CM

## 2016-11-21 DIAGNOSIS — O0993 Supervision of high risk pregnancy, unspecified, third trimester: Secondary | ICD-10-CM | POA: Diagnosis not present

## 2016-11-21 DIAGNOSIS — O24419 Gestational diabetes mellitus in pregnancy, unspecified control: Secondary | ICD-10-CM | POA: Diagnosis not present

## 2016-11-21 DIAGNOSIS — Z331 Pregnant state, incidental: Secondary | ICD-10-CM

## 2016-11-21 LAB — POCT URINALYSIS DIPSTICK
Glucose, UA: NEGATIVE
Leukocytes, UA: NEGATIVE
NITRITE UA: NEGATIVE
PROTEIN UA: NEGATIVE
RBC UA: NEGATIVE

## 2016-11-21 NOTE — Progress Notes (Signed)
Fetal Surveillance Testing today:  Reactive NST   High Risk Pregnancy Diagnosis(es):   Class A2 DM, single umbilical artery  G3P2002 656w5d Estimated Date of Delivery: 12/21/16  Blood pressure 120/76, pulse 72, weight 230 lb (104.3 kg), last menstrual period 02/27/2016.  Urinalysis: Negative   HPI: The patient is being seen today for ongoing management of as above. Today she reports CBG are much better, 1 am low,    BP weight and urine results all reviewed and noted. Patient reports good fetal movement, denies any bleeding and no rupture of membranes symptoms or regular contractions.  Fundal Height:  38 Fetal Heart rate:  135 Edema:  trace  Patient is without complaints other than noted in her HPI. All questions were answered.  All lab and sonogram results have been reviewed. Comments:    Assessment:  1.  Pregnancy at 436w5d,  Estimated Date of Delivery: 12/21/16 :                          2.  Class A2 DM                        3.  Single umbilical artery  Medication(s) Plans:  Glyburide 5 BID, NPH increase to 12 units at hs  Treatment Plan:  Twice weekly NST, EFW next week  Return in about 5 days (around 11/26/2016) for BPP/sono, EFW , HROB. for appointment for high risk OB care  No orders of the defined types were placed in this encounter.  Orders Placed This Encounter  Procedures  . US FETAL BPP W/NONSTRESS  . US OB Follow Up  . POCT urinalysis dipstick

## 2016-11-22 NOTE — Progress Notes (Signed)
Fetal Surveillance Testing today:  Reactive NST   High Risk Pregnancy Diagnosis(es):   Class A2 DM  G3P2002 8529w1d Estimated Date of Delivery: 12/21/16  Blood pressure 124/74, pulse 67, weight 229 lb (103.9 kg), last menstrual period 02/27/2016.  Urinalysis: Negative   HPI: The patient is being seen today for ongoing management of as above. Today she reports CBG are doing well   BP weight and urine results all reviewed and noted. Patient reports good fetal movement, denies any bleeding and no rupture of membranes symptoms or regular contractions.  Fundal Height:  38 Fetal Heart rate:  130 Edema:  trace  Patient is without complaints other than noted in her HPI. All questions were answered.  All lab and sonogram results have been reviewed. Comments:    Assessment:  1.  Pregnancy at 6282w6d,  Estimated Date of Delivery: 12/21/16 :                          2.  Class A2 DM                        3.  Previous C section x 2                        4.  Single umbilical artery  Medication(s) Plans:  Continue glyburide 5 mg BID + NPH insulin 10 units at bedtime  Treatment Plan:  Twice weekly surveillance by NST with EFW 36-37 weeks  No Follow-up on file. for appointment for high risk OB care  No orders of the defined types were placed in this encounter.  Orders Placed This Encounter  Procedures  . POCT urinalysis dipstick

## 2016-11-26 ENCOUNTER — Ambulatory Visit (INDEPENDENT_AMBULATORY_CARE_PROVIDER_SITE_OTHER): Payer: 59

## 2016-11-26 ENCOUNTER — Encounter: Payer: Self-pay | Admitting: Women's Health

## 2016-11-26 ENCOUNTER — Ambulatory Visit (INDEPENDENT_AMBULATORY_CARE_PROVIDER_SITE_OTHER): Payer: 59 | Admitting: Women's Health

## 2016-11-26 VITALS — BP 130/92 | HR 72 | Wt 237.0 lb

## 2016-11-26 DIAGNOSIS — Z98891 History of uterine scar from previous surgery: Secondary | ICD-10-CM

## 2016-11-26 DIAGNOSIS — O09299 Supervision of pregnancy with other poor reproductive or obstetric history, unspecified trimester: Secondary | ICD-10-CM

## 2016-11-26 DIAGNOSIS — O24419 Gestational diabetes mellitus in pregnancy, unspecified control: Secondary | ICD-10-CM

## 2016-11-26 DIAGNOSIS — O09893 Supervision of other high risk pregnancies, third trimester: Secondary | ICD-10-CM

## 2016-11-26 DIAGNOSIS — Z8632 Personal history of gestational diabetes: Secondary | ICD-10-CM

## 2016-11-26 DIAGNOSIS — O133 Gestational [pregnancy-induced] hypertension without significant proteinuria, third trimester: Secondary | ICD-10-CM

## 2016-11-26 DIAGNOSIS — O0993 Supervision of high risk pregnancy, unspecified, third trimester: Secondary | ICD-10-CM

## 2016-11-26 DIAGNOSIS — O0990 Supervision of high risk pregnancy, unspecified, unspecified trimester: Secondary | ICD-10-CM

## 2016-11-26 DIAGNOSIS — Z8759 Personal history of other complications of pregnancy, childbirth and the puerperium: Secondary | ICD-10-CM | POA: Insufficient documentation

## 2016-11-26 DIAGNOSIS — O09899 Supervision of other high risk pregnancies, unspecified trimester: Secondary | ICD-10-CM

## 2016-11-26 DIAGNOSIS — Z1389 Encounter for screening for other disorder: Secondary | ICD-10-CM

## 2016-11-26 DIAGNOSIS — Z331 Pregnant state, incidental: Secondary | ICD-10-CM

## 2016-11-26 LAB — POCT URINALYSIS DIPSTICK
Glucose, UA: NEGATIVE
Ketones, UA: NEGATIVE
Leukocytes, UA: NEGATIVE
NITRITE UA: NEGATIVE
Protein, UA: NEGATIVE
RBC UA: NEGATIVE

## 2016-11-26 NOTE — Patient Instructions (Addendum)
Increase bedtime NPH to 14 units  Call the office [phone]) or go to [facility] if:  You begin to have strong, frequent contractions  Your water breaks.  Sometimes it is a big gush of fluid, sometimes it is just a trickle that keeps getting your panties wet or running down your legs  You have vaginal bleeding.  It is normal to have a small amount of spotting if your cervix was checked.   You don't feel your baby moving like normal.  If you don't, get you something to eat and drink and lay down and focus on feeling your baby move.  You should feel at least 10 movements in 2 hours.  If you don't, you should call the office or go to [facility].    Call the office [phone]) or go to [facility] for these signs of pre-eclampsia:  Severe headache that does not go away with Tylenol  Visual changes- seeing spots, double, blurred vision  Pain under your right breast or upper abdomen that does not go away with Tums or heartburn medicine  Nausea and/or vomiting  Severe swelling in your hands, feet, and face    Hypertension During Pregnancy Hypertension, commonly called high blood pressure, is when the force of blood pumping through your arteries is too strong. Arteries are blood vessels that carry blood from the heart throughout the body. Hypertension during pregnancy can cause problems for you and your baby. Your baby may be born early (prematurely) or may not weigh as much as he or she should at birth. Very bad cases of hypertension during pregnancy can be life-threatening. Different types of hypertension can occur during pregnancy. These include:  Chronic hypertension. This happens when: ? You have hypertension before pregnancy and it continues during pregnancy. ? You develop hypertension before you are [redacted] weeks pregnant, and it continues during pregnancy.  Gestational hypertension. This is hypertension that develops after the 20th week of pregnancy.  Preeclampsia, also  called toxemia of pregnancy. This is a very serious type of hypertension that develops only during pregnancy. It affects the whole body, and it can be very dangerous for you and your baby.  Gestational hypertension and preeclampsia usually go away within 6 weeks after your baby is born. Women who have hypertension during pregnancy have a greater chance of developing hypertension later in life or during future pregnancies. What are the causes? The exact cause of hypertension is not known. What increases the risk? There are certain factors that make it more likely for you to develop hypertension during pregnancy. These include:  Having hypertension during a previous pregnancy or prior to pregnancy.  Being overweight.  Being older than age 34.  Being pregnant for the first time or being pregnant with more than one baby.  Becoming pregnant using fertilization methods such as IVF (in vitro fertilization).  Having diabetes, kidney problems, or systemic lupus erythematosus.  Having a family history of hypertension.  What are the signs or symptoms? Chronic hypertension and gestational hypertension rarely cause symptoms. Preeclampsia causes symptoms, which may include:  Increased protein in your urine. Your health care provider will check for this at every visit before you give birth (prenatal visit).  Severe headaches.  Sudden weight gain.  Swelling of the hands, face, legs, and feet.  Nausea and vomiting.  Vision problems, such as blurred or double vision.  Numbness in the face, arms, legs, and feet.  Dizziness.  Slurred speech.  Sensitivity to bright lights.  Abdominal pain.  Convulsions.  How is this diagnosed? You may be diagnosed with hypertension during a routine prenatal exam. At each prenatal visit, you may:  Have a urine test to check for high amounts of protein in your urine.  Have your blood pressure checked. A blood pressure reading is recorded as two  numbers, such as "120 over 80" (or 120/80). The first ("top") number is called the systolic pressure. It is a measure of the pressure in your arteries when your heart beats. The second ("bottom") number is called the diastolic pressure. It is a measure of the pressure in your arteries as your heart relaxes between beats. Blood pressure is measured in a unit called mm Hg. A normal blood pressure reading is: ? Systolic: below 120. ? Diastolic: below 80.  The type of hypertension that you are diagnosed with depends on your test results and when your symptoms developed.  Chronic hypertension is usually diagnosed before 20 weeks of pregnancy.  Gestational hypertension is usually diagnosed after 20 weeks of pregnancy.  Hypertension with high amounts of protein in the urine is diagnosed as preeclampsia.  Blood pressure measurements that stay above 160 systolic, or above 110 diastolic, are signs of severe preeclampsia.  How is this treated? Treatment for hypertension during pregnancy varies depending on the type of hypertension you have and how serious it is.  If you take medicines called ACE inhibitors to treat chronic hypertension, you may need to switch medicines. ACE inhibitors should not be taken during pregnancy.  If you have gestational hypertension, you may need to take blood pressure medicine.  If you are at risk for preeclampsia, your health care provider may recommend that you take a low-dose aspirin every day to prevent high blood pressure during your pregnancy.  If you have severe preeclampsia, you may need to be hospitalized so you and your baby can be monitored closely. You may also need to take medicine (magnesium sulfate) to prevent seizures and to lower blood pressure. This medicine may be given as an injection or through an IV tube.  In some cases, if your condition gets worse, you may need to deliver your baby early.  Follow these instructions at home: Eating and  drinking  Drink enough fluid to keep your urine clear or pale yellow.  Eat a healthy diet that is low in salt (sodium). Do not add salt to your food. Check food labels to see how much sodium a food or beverage contains. Lifestyle  Do not use any products that contain nicotine or tobacco, such as cigarettes and e-cigarettes. If you need help quitting, ask your health care provider.  Do not use alcohol.  Avoid caffeine.  Avoid stress as much as possible. Rest and get plenty of sleep. General instructions  Take over-the-counter and prescription medicines only as told by your health care provider.  While lying down, lie on your left side. This keeps pressure off your baby.  While sitting or lying down, raise (elevate) your feet. Try putting some pillows under your lower legs.  Exercise regularly. Ask your health care provider what kinds of exercise are best for you.  Keep all prenatal and follow-up visits as told by your health care provider. This is important. Contact a health care provider if:  You have symptoms that your health care provider told you may require more treatment or monitoring, such as: ? Fever. ? Vomiting. ? Headache. Get help right away if:  You have severe abdominal pain or vomiting that does not get  better with treatment.  You suddenly develop swelling in your hands, ankles, or face.  You gain 4 lbs (1.8 kg) or more in 1 week.  You develop vaginal bleeding, or you have blood in your urine.  You do not feel your baby moving as much as usual.  You have blurred or double vision.  You have muscle twitching or sudden tightening (spasms).  You have shortness of breath.  Your lips or fingernails turn blue. This information is not intended to replace advice given to you by your health care provider. Make sure you discuss any questions you have with your health care provider. Document Released: 02/27/2011 Document Revised: 12/30/2015 Document Reviewed:  11/25/2015 Elsevier Interactive Patient Education  [employer]2018 [employer] Inc[employer]

## 2016-11-26 NOTE — Progress Notes (Signed)
High Risk Pregnancy Diagnosis(es): Sherry Friar2DM, 2VC, GHTN dx today G3P2002 [redacted]w[redacted]d Estimated Date of Delivery: 12/21/16 BP (!) 130/92   Pulse 72   Wt 237 lb (107.5 kg)   LMP 02/27/2016 (Exact Date)   BMI 40.68 kg/m   Urinalysis: Negative HPI:  Doing well.  BP slightly elevated today, was 140/86 @ 34wks, no proteinuria. Does have h/o pre-e Occ headaches relieved by apap- had earlier in pregnancy, resolved for a little bit, and returned a few weeks ago. Denies visual changes, ruq/epigastric pain, n/v.   On glyburide 5mg  AM&PM, increased NPH to 12units qhs last Wed FBS 66-95 (3 of 5 >90), 2hr pp 74-130 (2>120) 7lb weight gain since last Wed, denies changes in eating habits BP, weight, and urine reviewed.  Reports good fm. Denies regular uc's, lof, vb, uti s/s.   Fundal Height:  35 Fetal Heart rate:  135 u/s Edema:  Trace DTRs 2+, no clonus  Reviewed today's u/s: vtx, AFI 13.89cm, EFW 57%, BPP 8/8 Discussed pre-e s/s, ptl s/s, fkc All questions were answered Assessment: 674w3d A2DM, 2VC, GHTN dx today Medication(s) Plans:  Glyburide 5mg  AM&PM, Discussed w/ LHE- increase NPH to 14units qhs, baby asa Treatment Plan:  Pre-e labs today, 2x/wk testing now w/ nst alt w/ bpp/dopp, deliver @ 39wks (has RCS/BTL scheduled for 6/22) Follow up in 3d for high-risk OB appt and NST

## 2016-11-26 NOTE — Progress Notes (Signed)
Growth/BPP today at 36+[redacted] weeks GA.  Single, active female fetus in a cephalic presentation. FHR 135 bpm.  Posterior Gr 1 placenta. AFI is within normal limits, 13.89 cm with SVP 5.27 cm. EFW today of 3032 g (57%) which is consistent with dating. BPP 8/8.

## 2016-11-27 LAB — COMPREHENSIVE METABOLIC PANEL
A/G RATIO: 1.4 (ref 1.2–2.2)
ALK PHOS: 115 IU/L (ref 39–117)
ALT: 19 IU/L (ref 0–32)
AST: 24 IU/L (ref 0–40)
Albumin: 3.6 g/dL (ref 3.5–5.5)
BUN/Creatinine Ratio: 18 (ref 9–23)
BUN: 13 mg/dL (ref 6–20)
Bilirubin Total: <0.2 mg/dL (ref 0.0–1.2)
CO2: 18 mmol/L (ref 18–29)
Calcium: 9.4 mg/dL (ref 8.7–10.2)
Chloride: 108 mmol/L — ABNORMAL HIGH (ref 96–106)
Creatinine, Ser: 0.72 mg/dL (ref 0.57–1.00)
GFR calc Af Amer: 126 mL/min/{1.73_m2} (ref >59)
GFR calc non Af Amer: 110 mL/min/{1.73_m2} (ref >59)
Globulin, Total: 2.5 g/dL (ref 1.5–4.5)
Glucose: 67 mg/dL (ref 65–99)
Potassium: 4.4 mmol/L (ref 3.5–5.2)
Sodium: 140 mmol/L (ref 134–144)
Total Protein: 6.1 g/dL (ref 6.0–8.5)

## 2016-11-27 LAB — CBC
HEMATOCRIT: 40.1 % (ref 34.0–46.6)
Hemoglobin: 13.7 g/dL (ref 11.1–15.9)
MCH: 29.8 pg (ref 26.6–33.0)
MCHC: 34.2 g/dL (ref 31.5–35.7)
MCV: 87 fL (ref 79–97)
Platelets: 268 10*3/uL (ref 150–379)
RBC: 4.6 x10E6/uL (ref 3.77–5.28)
RDW: 14.1 % (ref 12.3–15.4)
WBC: 10.6 10*3/uL (ref 3.4–10.8)

## 2016-11-27 LAB — PROTEIN / CREATININE RATIO, URINE
CREATININE, UR: 60.4 mg/dL
PROTEIN UR: 21.2 mg/dL
PROTEIN/CREAT RATIO: 351 mg/g{creat} — AB (ref 0–200)

## 2016-11-29 ENCOUNTER — Encounter: Payer: Self-pay | Admitting: Obstetrics & Gynecology

## 2016-11-29 ENCOUNTER — Ambulatory Visit (INDEPENDENT_AMBULATORY_CARE_PROVIDER_SITE_OTHER): Payer: 59 | Admitting: Obstetrics & Gynecology

## 2016-11-29 VITALS — BP 124/82 | HR 68 | Wt 230.0 lb

## 2016-11-29 DIAGNOSIS — Z331 Pregnant state, incidental: Secondary | ICD-10-CM

## 2016-11-29 DIAGNOSIS — Z3A36 36 weeks gestation of pregnancy: Secondary | ICD-10-CM | POA: Diagnosis not present

## 2016-11-29 DIAGNOSIS — O24419 Gestational diabetes mellitus in pregnancy, unspecified control: Secondary | ICD-10-CM | POA: Diagnosis not present

## 2016-11-29 DIAGNOSIS — O09899 Supervision of other high risk pregnancies, unspecified trimester: Secondary | ICD-10-CM

## 2016-11-29 DIAGNOSIS — O0993 Supervision of high risk pregnancy, unspecified, third trimester: Secondary | ICD-10-CM

## 2016-11-29 DIAGNOSIS — Z3483 Encounter for supervision of other normal pregnancy, third trimester: Secondary | ICD-10-CM

## 2016-11-29 DIAGNOSIS — Z98891 History of uterine scar from previous surgery: Secondary | ICD-10-CM

## 2016-11-29 DIAGNOSIS — Z8759 Personal history of other complications of pregnancy, childbirth and the puerperium: Secondary | ICD-10-CM

## 2016-11-29 DIAGNOSIS — Z3A33 33 weeks gestation of pregnancy: Secondary | ICD-10-CM | POA: Diagnosis not present

## 2016-11-29 DIAGNOSIS — Z1389 Encounter for screening for other disorder: Secondary | ICD-10-CM

## 2016-11-29 LAB — POCT URINALYSIS DIPSTICK
Blood, UA: NEGATIVE
GLUCOSE UA: NEGATIVE
KETONES UA: NEGATIVE
Leukocytes, UA: NEGATIVE
Nitrite, UA: NEGATIVE
PROTEIN UA: NEGATIVE

## 2016-11-29 NOTE — Progress Notes (Signed)
Fetal Surveillance Testing today:  Reactive NST   High Risk Pregnancy Diagnosis(es):   Class A2 DM, single umbilical artery  [id number] 1w6dEstimated Date of Delivery: 12/21/16  Blood pressure 124/82, pulse 68, weight 230 lb (104.3 kg), last menstrual period 02/27/2016.  Urinalysis: Negative   HPI: The patient is being seen today for ongoing management of Class A2 DM, single umbilical artery, borderline BP, today good BP Today she reports CBG are good   BP weight and urine results all reviewed and noted. Patient reports good fetal movement, denies any bleeding and no rupture of membranes symptoms or regular contractions.  Fundal Height:  38 Fetal Heart rate:  125 Edema:  negative  Patient is without complaints other than noted in her HPI. All questions were answered.  All lab and sonogram results have been reviewed. Comments:    Assessment:  1.  Pregnancy at 358w6d Estimated Date of Delivery: 12/21/16 :                          2.  Class A2 DM                        3.  Single umbilical artery                        4.  Borderline hypertensive disorder  Medication(s) Plans:  Glyburide 5 mg BID, NPH 14 units per day  Treatment Plan:  Twice weekly NST, spot urine Pr/Cr is 354 but her BP does not/has not met criteria Urine negative today as well Continue to monitor closely and will deliver sooner if indicated  Return in about 4 days (around 12/03/2016) for NST, HROB. for appointment for high risk OB care  No orders of the defined types were placed in this encounter.  Orders Placed This Encounter  Procedures  . GC/Chlamydia Probe Amp  . Culture, beta strep (group b only)  . POCT urinalysis dipstick

## 2016-12-01 LAB — GC/CHLAMYDIA PROBE AMP
Chlamydia trachomatis, NAA: NEGATIVE
NEISSERIA GONORRHOEAE BY PCR: NEGATIVE

## 2016-12-03 ENCOUNTER — Encounter: Payer: Self-pay | Admitting: Obstetrics & Gynecology

## 2016-12-03 ENCOUNTER — Telehealth (HOSPITAL_COMMUNITY): Payer: Self-pay | Admitting: *Deleted

## 2016-12-03 ENCOUNTER — Ambulatory Visit (INDEPENDENT_AMBULATORY_CARE_PROVIDER_SITE_OTHER): Payer: 59 | Admitting: Obstetrics & Gynecology

## 2016-12-03 VITALS — BP 130/96 | HR 68 | Wt 230.0 lb

## 2016-12-03 DIAGNOSIS — Z98891 History of uterine scar from previous surgery: Secondary | ICD-10-CM

## 2016-12-03 DIAGNOSIS — O24419 Gestational diabetes mellitus in pregnancy, unspecified control: Secondary | ICD-10-CM

## 2016-12-03 DIAGNOSIS — Z8759 Personal history of other complications of pregnancy, childbirth and the puerperium: Secondary | ICD-10-CM

## 2016-12-03 DIAGNOSIS — Z1389 Encounter for screening for other disorder: Secondary | ICD-10-CM

## 2016-12-03 DIAGNOSIS — O0993 Supervision of high risk pregnancy, unspecified, third trimester: Secondary | ICD-10-CM

## 2016-12-03 DIAGNOSIS — Z331 Pregnant state, incidental: Secondary | ICD-10-CM

## 2016-12-03 DIAGNOSIS — O09899 Supervision of other high risk pregnancies, unspecified trimester: Secondary | ICD-10-CM

## 2016-12-03 LAB — POCT URINALYSIS DIPSTICK
Glucose, UA: NEGATIVE
Ketones, UA: NEGATIVE
Nitrite, UA: NEGATIVE
PROTEIN UA: NEGATIVE

## 2016-12-03 LAB — CULTURE, BETA STREP (GROUP B ONLY): Strep Gp B Culture: NEGATIVE

## 2016-12-03 NOTE — Progress Notes (Signed)
Fetal Surveillance Testing today:  Reactive NST   High Risk Pregnancy Diagnosis(es):   Class A2 DM, borderline GHTN  G3P2002 4318w3d Estimated Date of Delivery: 12/21/16  Blood pressure (!) 130/96, pulse 68, weight 230 lb (104.3 kg), last menstrual period 02/27/2016.  Urinalysis: Negative   HPI: The patient is being seen today for ongoing management of as above. Today she reports CBG excellent, BP at home 4 times daily are excellent high diastolic is 86, no SBP>140   BP weight and urine results all reviewed and noted. Patient reports good fetal movement, denies any bleeding and no rupture of membranes symptoms or regular contractions.  Fundal Height:  39 Fetal Heart rate:  135 Edema:  none  Patient is without complaints other than noted in her HPI. All questions were answered.  All lab and sonogram results have been reviewed. Comments:    Assessment:  1.  Pregnancy at 3618w3d,  Estimated Date of Delivery: 12/21/16 :                          2.  Class A2 DM                        3.  Borderline GHTN                       4.  Single umbilical artery  Medication(s) Plans:  Glyburide 5 BID, NPH 14 units at 2200  Treatment Plan:  BP up a little today but all of her BP at home are good, she will continue, continue twice weekly fetal surveillance with delivery scheduled at 39 weeks at this point, subject to change if clinically indicated  Return in about 3 days (around 12/06/2016) for NST, HROB, with Dr Mechele DawleyEure(work her into my schedule, but have her come before 1230 for her NST). for appointment for high risk OB care  No orders of the defined types were placed in this encounter.  Orders Placed This Encounter  Procedures  . POCT urinalysis dipstick

## 2016-12-03 NOTE — Telephone Encounter (Signed)
Preadmission screen  

## 2016-12-05 ENCOUNTER — Encounter (HOSPITAL_COMMUNITY): Payer: Self-pay

## 2016-12-06 ENCOUNTER — Ambulatory Visit (INDEPENDENT_AMBULATORY_CARE_PROVIDER_SITE_OTHER): Payer: 59 | Admitting: Obstetrics & Gynecology

## 2016-12-06 ENCOUNTER — Encounter: Payer: Self-pay | Admitting: Obstetrics & Gynecology

## 2016-12-06 ENCOUNTER — Telehealth: Payer: Self-pay | Admitting: *Deleted

## 2016-12-06 VITALS — BP 130/86 | HR 70 | Wt 231.0 lb

## 2016-12-06 DIAGNOSIS — O09899 Supervision of other high risk pregnancies, unspecified trimester: Secondary | ICD-10-CM

## 2016-12-06 DIAGNOSIS — O24419 Gestational diabetes mellitus in pregnancy, unspecified control: Secondary | ICD-10-CM

## 2016-12-06 DIAGNOSIS — O09893 Supervision of other high risk pregnancies, third trimester: Secondary | ICD-10-CM

## 2016-12-06 DIAGNOSIS — Z331 Pregnant state, incidental: Secondary | ICD-10-CM

## 2016-12-06 DIAGNOSIS — Z1389 Encounter for screening for other disorder: Secondary | ICD-10-CM

## 2016-12-06 DIAGNOSIS — O0993 Supervision of high risk pregnancy, unspecified, third trimester: Secondary | ICD-10-CM

## 2016-12-06 LAB — POCT URINALYSIS DIPSTICK
Blood, UA: NEGATIVE
Glucose, UA: NEGATIVE
Ketones, UA: NEGATIVE
Leukocytes, UA: NEGATIVE
Nitrite, UA: NEGATIVE
Protein, UA: NEGATIVE

## 2016-12-06 NOTE — Progress Notes (Signed)
Fetal Surveillance Testing today:  Reactive NST   High Risk Pregnancy Diagnosis(es):   Class AII diabetes, single umbilical artery, borderline gestational hypertension seemingly on the side of not being gestational hypertension today  [id number] 2883w6d Estimated Date of Delivery: 12/21/16  Blood pressure 130/86, pulse 70, weight 231 lb (104.8 kg), last menstrual period 02/27/2016.  Urinalysis: Negative   HPI: The patient is being seen today for ongoing management of as above. Today she reports CBGs at home are all good and all of her blood pressures are good she had one that was slight 132/89 but all the rest of them are excellent   BP weight and urine results all reviewed and noted. Patient reports good fetal movement, denies any bleeding and no rupture of membranes symptoms or regular contractions.  Fundal Height:  39 Fetal Heart rate:  135 Edema:  Trace  Patient is without complaints other than noted in her HPI. All questions were answered.  All lab and sonogram results have been reviewed. Comments:    Assessment:  1.  Pregnancy at 2683w6d,  Estimated Date of Delivery: 12/21/16 :                          2.  Class AII diabetes                        3.  Single umbilical artery  Medication(s) Plans:  Continue glyburide 5 mg twice a day and NPH 14 units at 2200  Treatment Plan:  Patient scheduled for a repeat C-section and tubal ligation next Friday In the meantime we'll do an NST on 12/11/2016 and then now we will want to do another testing since she'll have her C-section on the 22nd  Return in about 5 days (around 12/11/2016) for NST, HROB, with Dr Despina HiddenEure. for appointment for high risk OB care  No orders of the defined types were placed in this encounter.  Orders Placed This Encounter  Procedures  . POCT urinalysis dipstick

## 2016-12-06 NOTE — Telephone Encounter (Signed)
Patient called stating she noticed when she went to the bathroom a little light spotting x1. She is having very mild cramping but "nothing major". Advised patient to continue to monitor since it was just 1 time and if bleeding increases, cramping intensifies, she starts leaking fluid or notices a change in fetal movement to [facility] Verbalized understanding.

## 2016-12-11 ENCOUNTER — Ambulatory Visit (INDEPENDENT_AMBULATORY_CARE_PROVIDER_SITE_OTHER): Payer: 59 | Admitting: Obstetrics & Gynecology

## 2016-12-11 ENCOUNTER — Encounter: Payer: Self-pay | Admitting: Obstetrics & Gynecology

## 2016-12-11 VITALS — BP 120/80 | HR 78 | Wt 232.0 lb

## 2016-12-11 DIAGNOSIS — O0993 Supervision of high risk pregnancy, unspecified, third trimester: Secondary | ICD-10-CM

## 2016-12-11 DIAGNOSIS — O09899 Supervision of other high risk pregnancies, unspecified trimester: Secondary | ICD-10-CM

## 2016-12-11 DIAGNOSIS — Z794 Long term (current) use of insulin: Secondary | ICD-10-CM

## 2016-12-11 DIAGNOSIS — Z1389 Encounter for screening for other disorder: Secondary | ICD-10-CM

## 2016-12-11 DIAGNOSIS — O24419 Gestational diabetes mellitus in pregnancy, unspecified control: Secondary | ICD-10-CM

## 2016-12-11 DIAGNOSIS — Z331 Pregnant state, incidental: Secondary | ICD-10-CM

## 2016-12-11 LAB — POCT URINALYSIS DIPSTICK
GLUCOSE UA: NEGATIVE
KETONES UA: NEGATIVE
Leukocytes, UA: NEGATIVE
NITRITE UA: NEGATIVE
Protein, UA: NEGATIVE
RBC UA: NEGATIVE

## 2016-12-11 NOTE — Progress Notes (Signed)
Fetal Surveillance Testing today:  Reactive NST   High Risk Pregnancy Diagnosis(es):   Class AII diabetes and single umbilical artery  G3P2002 1065w4d Estimated Date of Delivery: 12/21/16  Blood pressure 120/80, pulse 78, weight 232 lb (105.2 kg), last menstrual period 02/27/2016.  Urinalysis: Negative   HPI: The patient is being seen today for ongoing management of as above. Today she reports CBGs are all good   BP weight and urine results all reviewed and noted. Patient reports good fetal movement, denies any bleeding and no rupture of membranes symptoms or regular contractions.  Fundal Height:  40 Fetal Heart rate:  140 Edema:  Trace  Patient is without complaints other than noted in her HPI. All questions were answered.  All lab and sonogram results have been reviewed. Comments:    Assessment:  1.  Pregnancy at 3965w4d,  Estimated Date of Delivery: 12/21/16 :                          2.  Class AII diabetes                        3.  Single umbilical artery  Medication(s) Plans:  Glyburide 5 mg twice a day and NPH 14 units at 2200  Treatment Plan:  Patient scheduled for C-section and tubal ligation in 3 days, if anything were to happen in the meantime she'll certainly not a [facility] for further evaluation or call the office  Return in about 10 days (around 12/21/2016) for Post Op. for appointment for high risk OB care  No orders of the defined types were placed in this encounter.  Orders Placed This Encounter  Procedures  . POCT urinalysis dipstick

## 2016-12-13 ENCOUNTER — Other Ambulatory Visit: Payer: Self-pay | Admitting: Obstetrics & Gynecology

## 2016-12-13 ENCOUNTER — Encounter (HOSPITAL_COMMUNITY)
Admission: RE | Admit: 2016-12-13 | Discharge: 2016-12-13 | Disposition: A | Discharge status: Home / Self Care | Payer: 59 | Source: Ambulatory Visit | Attending: Obstetrics and Gynecology | Admitting: Obstetrics and Gynecology

## 2016-12-13 DIAGNOSIS — O134 Gestational [pregnancy-induced] hypertension without significant proteinuria, complicating childbirth: Secondary | ICD-10-CM | POA: Diagnosis not present

## 2016-12-13 DIAGNOSIS — Z302 Encounter for sterilization: Secondary | ICD-10-CM | POA: Diagnosis not present

## 2016-12-13 DIAGNOSIS — O24425 Gestational diabetes mellitus in childbirth, controlled by oral hypoglycemic drugs: Secondary | ICD-10-CM | POA: Diagnosis not present

## 2016-12-13 DIAGNOSIS — O0990 Supervision of high risk pregnancy, unspecified, unspecified trimester: Secondary | ICD-10-CM

## 2016-12-13 DIAGNOSIS — Z87891 Personal history of nicotine dependence: Secondary | ICD-10-CM | POA: Diagnosis not present

## 2016-12-13 DIAGNOSIS — Z6839 Body mass index (BMI) 39.0-39.9, adult: Secondary | ICD-10-CM | POA: Diagnosis not present

## 2016-12-13 DIAGNOSIS — O34211 Maternal care for low transverse scar from previous cesarean delivery: Secondary | ICD-10-CM | POA: Diagnosis not present

## 2016-12-13 DIAGNOSIS — O99214 Obesity complicating childbirth: Secondary | ICD-10-CM | POA: Diagnosis not present

## 2016-12-13 LAB — CBC
HEMATOCRIT: 41.1 % (ref 36.0–46.0)
HEMOGLOBIN: 14.1 g/dL (ref 12.0–15.0)
MCH: 29.9 pg (ref 26.0–34.0)
MCHC: 34.3 g/dL (ref 30.0–36.0)
MCV: 87.3 fL (ref 78.0–100.0)
Platelets: 256 10*3/uL (ref 150–400)
RBC: 4.71 MIL/uL (ref 3.87–5.11)
RDW: 14.1 % (ref 11.5–15.5)
WBC: 11.5 10*3/uL — ABNORMAL HIGH (ref 4.0–10.5)

## 2016-12-13 LAB — ABO/RH: ABO/RH(D): A POS

## 2016-12-13 LAB — COMPREHENSIVE METABOLIC PANEL
ALK PHOS: 142 U/L — AB (ref 38–126)
ALT: 40 U/L (ref 14–54)
AST: 37 U/L (ref 15–41)
Albumin: 3.2 g/dL — ABNORMAL LOW (ref 3.5–5.0)
Anion gap: 10 (ref 5–15)
BUN: 15 mg/dL (ref 6–20)
CALCIUM: 9.6 mg/dL (ref 8.9–10.3)
CO2: 17 mmol/L — AB (ref 22–32)
Chloride: 109 mmol/L (ref 101–111)
Creatinine, Ser: 0.76 mg/dL (ref 0.44–1.00)
GFR calc Af Amer: >60 mL/min (ref >60)
GFR calc non Af Amer: >60 mL/min (ref >60)
Glucose, Bld: 86 mg/dL (ref 65–99)
Potassium: 4.3 mmol/L (ref 3.5–5.1)
SODIUM: 136 mmol/L (ref 135–145)
Total Bilirubin: 0.6 mg/dL (ref 0.3–1.2)
Total Protein: 6.1 g/dL — ABNORMAL LOW (ref 6.5–8.1)

## 2016-12-13 LAB — TYPE AND SCREEN
ABO/RH(D): A POS
Antibody Screen: NEGATIVE

## 2016-12-13 NOTE — Patient Instructions (Signed)
20 [name]  12/13/2016   Your procedure is scheduled on:  12/14/2016  [facility] through [facility] Entr[facility] at 0630 AM.  Pick up the phone at the desk and dial [phone].   Call this number if you have problems the morning of surgery: [phone]   Remember:   Do not eat food:After Midnight.  Do not drink clear liquids: After Midnight.  Take these medicines the morning of surgery with A SIP OF WATER: none   Do not wear jewelry, make-up or nail polish.  Do not wear lotions, powders, or perfumes. Do not wear deodorant.  Do not shave 48 hours prior to surgery.  Do not bring valuables to the hospital.  [facility] not   responsible for any belongings or valuables brought to the hospital.  Contacts, dentures or bridgework may not be worn into surgery.  Leave suitcase in the car. After surgery it may be brought to your room.  For patients admitted to the hospital, checkout time is 11:00 AM the day of              discharge.   Patients discharged the day of surgery will not be allowed to drive             home.  Name and phone number of your driver: na   Special Instructions:   N/A   Please read over the following fact sheets that you were given:   Surgical Site Infection Prevention

## 2016-12-14 ENCOUNTER — Inpatient Hospital Stay (HOSPITAL_COMMUNITY): Payer: 59 | Admitting: Anesthesiology

## 2016-12-14 ENCOUNTER — Encounter (HOSPITAL_COMMUNITY): Payer: Self-pay | Admitting: Anesthesiology

## 2016-12-14 ENCOUNTER — Inpatient Hospital Stay (HOSPITAL_COMMUNITY)
Admission: RE | Admit: 2016-12-14 | Discharge: 2016-12-16 | DRG: 766 | Disposition: A | Discharge status: Home / Self Care | Payer: 59 | Source: Ambulatory Visit | Attending: Obstetrics and Gynecology | Admitting: Obstetrics and Gynecology

## 2016-12-14 ENCOUNTER — Encounter (HOSPITAL_COMMUNITY)
Admission: RE | Disposition: A | Discharge status: Home / Self Care | Payer: Self-pay | Source: Ambulatory Visit | Attending: Obstetrics and Gynecology

## 2016-12-14 DIAGNOSIS — Z8759 Personal history of other complications of pregnancy, childbirth and the puerperium: Secondary | ICD-10-CM | POA: Diagnosis present

## 2016-12-14 DIAGNOSIS — O99214 Obesity complicating childbirth: Secondary | ICD-10-CM | POA: Diagnosis present

## 2016-12-14 DIAGNOSIS — O24425 Gestational diabetes mellitus in childbirth, controlled by oral hypoglycemic drugs: Secondary | ICD-10-CM | POA: Diagnosis present

## 2016-12-14 DIAGNOSIS — Z98891 History of uterine scar from previous surgery: Secondary | ICD-10-CM

## 2016-12-14 DIAGNOSIS — O0990 Supervision of high risk pregnancy, unspecified, unspecified trimester: Secondary | ICD-10-CM

## 2016-12-14 DIAGNOSIS — Z6839 Body mass index (BMI) 39.0-39.9, adult: Secondary | ICD-10-CM | POA: Diagnosis not present

## 2016-12-14 DIAGNOSIS — Z3A39 39 weeks gestation of pregnancy: Secondary | ICD-10-CM

## 2016-12-14 DIAGNOSIS — Z302 Encounter for sterilization: Secondary | ICD-10-CM

## 2016-12-14 DIAGNOSIS — Z87891 Personal history of nicotine dependence: Secondary | ICD-10-CM

## 2016-12-14 DIAGNOSIS — O09299 Supervision of pregnancy with other poor reproductive or obstetric history, unspecified trimester: Secondary | ICD-10-CM

## 2016-12-14 DIAGNOSIS — Z8632 Personal history of gestational diabetes: Secondary | ICD-10-CM

## 2016-12-14 DIAGNOSIS — O34219 Maternal care for unspecified type scar from previous cesarean delivery: Secondary | ICD-10-CM | POA: Diagnosis not present

## 2016-12-14 DIAGNOSIS — O134 Gestational [pregnancy-induced] hypertension without significant proteinuria, complicating childbirth: Secondary | ICD-10-CM | POA: Diagnosis present

## 2016-12-14 DIAGNOSIS — O34211 Maternal care for low transverse scar from previous cesarean delivery: Secondary | ICD-10-CM | POA: Diagnosis not present

## 2016-12-14 DIAGNOSIS — O09899 Supervision of other high risk pregnancies, unspecified trimester: Secondary | ICD-10-CM

## 2016-12-14 DIAGNOSIS — O133 Gestational [pregnancy-induced] hypertension without significant proteinuria, third trimester: Secondary | ICD-10-CM

## 2016-12-14 DIAGNOSIS — O24419 Gestational diabetes mellitus in pregnancy, unspecified control: Secondary | ICD-10-CM

## 2016-12-14 LAB — GLUCOSE, CAPILLARY
GLUCOSE-CAPILLARY: 107 mg/dL — AB (ref 65–99)
GLUCOSE-CAPILLARY: 122 mg/dL — AB (ref 65–99)
Glucose-Capillary: 107 mg/dL — ABNORMAL HIGH (ref 65–99)
Glucose-Capillary: 88 mg/dL (ref 65–99)

## 2016-12-14 LAB — TYPE AND SCREEN
ABO/RH(D): A POS
Antibody Screen: NEGATIVE

## 2016-12-14 LAB — CBC
HCT: 40.2 % (ref 36.0–46.0)
HEMOGLOBIN: 13.7 g/dL (ref 12.0–15.0)
MCH: 29.7 pg (ref 26.0–34.0)
MCHC: 34.1 g/dL (ref 30.0–36.0)
MCV: 87 fL (ref 78.0–100.0)
Platelets: 248 10*3/uL (ref 150–400)
RBC: 4.62 MIL/uL (ref 3.87–5.11)
RDW: 14.1 % (ref 11.5–15.5)
WBC: 11.8 10*3/uL — ABNORMAL HIGH (ref 4.0–10.5)

## 2016-12-14 LAB — RPR: RPR Ser Ql: NONREACTIVE

## 2016-12-14 SURGERY — Surgical Case
Anesthesia: Spinal | Site: Abdomen | Laterality: Bilateral | Wound class: Clean Contaminated

## 2016-12-14 MED ORDER — SODIUM CHLORIDE 0.9 % IR SOLN
Status: DC | PRN
  Administered 2016-12-14: 1

## 2016-12-14 MED ORDER — LACTATED RINGERS IV SOLN
INTRAVENOUS | Status: DC
  Administered 2016-12-14 (×2): via INTRAVENOUS

## 2016-12-14 MED ORDER — LACTATED RINGERS IV SOLN
INTRAVENOUS | Status: DC | PRN
  Administered 2016-12-14: 40 [IU] via INTRAVENOUS

## 2016-12-14 MED ORDER — PHENYLEPHRINE 8 MG IN D5W 100 ML (0.08MG/ML) PREMIX OPTIME
INJECTION | INTRAVENOUS | Status: DC | PRN
  Administered 2016-12-14: 60 ug/min via INTRAVENOUS

## 2016-12-14 MED ORDER — ONDANSETRON HCL 4 MG/2ML IJ SOLN
INTRAMUSCULAR | Status: AC
  Filled 2016-12-14: qty 2

## 2016-12-14 MED ORDER — ACETAMINOPHEN 325 MG PO TABS
650.0000 mg | ORAL_TABLET | ORAL | Status: DC | PRN
  Administered 2016-12-16: 650 mg via ORAL
  Filled 2016-12-14: qty 2

## 2016-12-14 MED ORDER — DIPHENHYDRAMINE HCL 25 MG PO CAPS: 25.0000 mg | ORAL_CAPSULE | Freq: Four times a day (QID) | ORAL | Status: DC | PRN

## 2016-12-14 MED ORDER — LACTATED RINGERS IV SOLN
INTRAVENOUS | Status: DC | PRN
  Administered 2016-12-14: 11:00:00 via INTRAVENOUS

## 2016-12-14 MED ORDER — BUPIVACAINE IN DEXTROSE 0.75-8.25 % IT SOLN
INTRATHECAL | Status: AC
  Filled 2016-12-14: qty 2

## 2016-12-14 MED ORDER — ERYTHROMYCIN 5 MG/GM OP OINT
TOPICAL_OINTMENT | OPHTHALMIC | Status: AC
  Filled 2016-12-14: qty 1

## 2016-12-14 MED ORDER — NALOXONE HCL 0.4 MG/ML IJ SOLN: 0.4000 mg | INTRAMUSCULAR | Status: DC | PRN

## 2016-12-14 MED ORDER — SIMETHICONE 80 MG PO CHEW
80.0000 mg | CHEWABLE_TABLET | ORAL | Status: DC
  Administered 2016-12-15 (×2): 80 mg via ORAL
  Filled 2016-12-14 (×2): qty 1

## 2016-12-14 MED ORDER — SCOPOLAMINE 1 MG/3DAYS TD PT72
1.0000 | MEDICATED_PATCH | Freq: Once | TRANSDERMAL | Status: DC
  Filled 2016-12-14: qty 1

## 2016-12-14 MED ORDER — SCOPOLAMINE 1 MG/3DAYS TD PT72
1.0000 | MEDICATED_PATCH | Freq: Once | TRANSDERMAL | Status: DC
  Administered 2016-12-14: 1.5 mg via TRANSDERMAL
  Filled 2016-12-14: qty 1

## 2016-12-14 MED ORDER — MENTHOL 3 MG MT LOZG: 1.0000 | LOZENGE | OROMUCOSAL | Status: DC | PRN

## 2016-12-14 MED ORDER — SENNOSIDES-DOCUSATE SODIUM 8.6-50 MG PO TABS
2.0000 | ORAL_TABLET | ORAL | Status: DC
  Administered 2016-12-15 (×2): 2 via ORAL
  Filled 2016-12-14 (×2): qty 2

## 2016-12-14 MED ORDER — KETOROLAC TROMETHAMINE 30 MG/ML IJ SOLN: 30.0000 mg | Freq: Four times a day (QID) | INTRAMUSCULAR | Status: AC | PRN

## 2016-12-14 MED ORDER — WITCH HAZEL-GLYCERIN EX PADS: 1.0000 "application " | MEDICATED_PAD | CUTANEOUS | Status: DC | PRN

## 2016-12-14 MED ORDER — MORPHINE SULFATE (PF) 0.5 MG/ML IJ SOLN
INTRAMUSCULAR | Status: AC
  Filled 2016-12-14: qty 10

## 2016-12-14 MED ORDER — NALOXONE HCL 2 MG/2ML IJ SOSY
1.0000 ug/kg/h | PREFILLED_SYRINGE | INTRAVENOUS | Status: DC | PRN
  Filled 2016-12-14: qty 2

## 2016-12-14 MED ORDER — NALBUPHINE HCL 10 MG/ML IJ SOLN: 5.0000 mg | INTRAMUSCULAR | Status: DC | PRN

## 2016-12-14 MED ORDER — PROMETHAZINE HCL 25 MG/ML IJ SOLN: 6.2500 mg | INTRAMUSCULAR | Status: DC | PRN

## 2016-12-14 MED ORDER — MEPERIDINE HCL 25 MG/ML IJ SOLN: 6.2500 mg | INTRAMUSCULAR | Status: DC | PRN

## 2016-12-14 MED ORDER — NALBUPHINE HCL 10 MG/ML IJ SOLN: 5.0000 mg | Freq: Once | INTRAMUSCULAR | Status: DC | PRN

## 2016-12-14 MED ORDER — ZOLPIDEM TARTRATE 5 MG PO TABS: 5.0000 mg | ORAL_TABLET | Freq: Every evening | ORAL | Status: DC | PRN

## 2016-12-14 MED ORDER — OXYTOCIN 10 UNIT/ML IJ SOLN
INTRAMUSCULAR | Status: AC
  Filled 2016-12-14: qty 4

## 2016-12-14 MED ORDER — FENTANYL CITRATE (PF) 100 MCG/2ML IJ SOLN
INTRAMUSCULAR | Status: DC | PRN
Start: 2016-12-14 — End: 2016-12-14
  Administered 2016-12-14: 20 ug via INTRATHECAL

## 2016-12-14 MED ORDER — ACETAMINOPHEN 500 MG PO TABS
1000.0000 mg | ORAL_TABLET | Freq: Four times a day (QID) | ORAL | Status: AC
  Administered 2016-12-14: 1000 mg via ORAL
  Filled 2016-12-14 (×2): qty 2

## 2016-12-14 MED ORDER — PRENATAL MULTIVITAMIN CH
1.0000 | ORAL_TABLET | Freq: Every day | ORAL | Status: DC
Start: 2016-12-14 — End: 2016-12-16
  Administered 2016-12-15 – 2016-12-16 (×2): 1 via ORAL
  Filled 2016-12-14 (×2): qty 1

## 2016-12-14 MED ORDER — DIBUCAINE 1 % RE OINT: 1.0000 "application " | TOPICAL_OINTMENT | RECTAL | Status: DC | PRN

## 2016-12-14 MED ORDER — VITAMIN K1 1 MG/0.5ML IJ SOLN
INTRAMUSCULAR | Status: AC
  Filled 2016-12-14: qty 0.5

## 2016-12-14 MED ORDER — PHENYLEPHRINE 8 MG IN D5W 100 ML (0.08MG/ML) PREMIX OPTIME
INJECTION | INTRAVENOUS | Status: AC
  Filled 2016-12-14: qty 100

## 2016-12-14 MED ORDER — OXYTOCIN 40 UNITS IN LACTATED RINGERS INFUSION - SIMPLE MED: 2.5000 [IU]/h | INTRAVENOUS | Status: AC

## 2016-12-14 MED ORDER — LACTATED RINGERS IV SOLN: INTRAVENOUS | Status: DC

## 2016-12-14 MED ORDER — MORPHINE SULFATE (PF) 0.5 MG/ML IJ SOLN
INTRAMUSCULAR | Status: DC | PRN
  Administered 2016-12-14: .2 mg via INTRATHECAL

## 2016-12-14 MED ORDER — CEFAZOLIN SODIUM-DEXTROSE 2-4 GM/100ML-% IV SOLN
2.0000 g | INTRAVENOUS | Status: AC
  Administered 2016-12-14: 2 g via INTRAVENOUS
  Filled 2016-12-14: qty 100

## 2016-12-14 MED ORDER — SIMETHICONE 80 MG PO CHEW: 80.0000 mg | CHEWABLE_TABLET | ORAL | Status: DC | PRN

## 2016-12-14 MED ORDER — BUPIVACAINE IN DEXTROSE 0.75-8.25 % IT SOLN
INTRATHECAL | Status: DC | PRN
  Administered 2016-12-14: 1.4 mL via INTRATHECAL

## 2016-12-14 MED ORDER — FENTANYL CITRATE (PF) 100 MCG/2ML IJ SOLN
INTRAMUSCULAR | Status: AC
  Filled 2016-12-14: qty 2

## 2016-12-14 MED ORDER — IBUPROFEN 600 MG PO TABS
600.0000 mg | ORAL_TABLET | Freq: Four times a day (QID) | ORAL | Status: DC
  Administered 2016-12-14 – 2016-12-16 (×8): 600 mg via ORAL
  Filled 2016-12-14 (×8): qty 1

## 2016-12-14 MED ORDER — ONDANSETRON HCL 4 MG/2ML IJ SOLN
4.0000 mg | Freq: Three times a day (TID) | INTRAMUSCULAR | Status: DC | PRN
  Administered 2016-12-14: 4 mg via INTRAVENOUS
  Filled 2016-12-14: qty 2

## 2016-12-14 MED ORDER — SIMETHICONE 80 MG PO CHEW
80.0000 mg | CHEWABLE_TABLET | Freq: Three times a day (TID) | ORAL | Status: DC
  Administered 2016-12-14 – 2016-12-16 (×6): 80 mg via ORAL
  Filled 2016-12-14 (×5): qty 1

## 2016-12-14 MED ORDER — ONDANSETRON HCL 4 MG/2ML IJ SOLN
INTRAMUSCULAR | Status: DC | PRN
  Administered 2016-12-14: 4 mg via INTRAVENOUS

## 2016-12-14 MED ORDER — IBUPROFEN 600 MG PO TABS: 600.0000 mg | ORAL_TABLET | Freq: Four times a day (QID) | ORAL | Status: DC | PRN

## 2016-12-14 MED ORDER — NALBUPHINE HCL 10 MG/ML IJ SOLN
5.0000 mg | INTRAMUSCULAR | Status: DC | PRN
  Administered 2016-12-14: 5 mg via SUBCUTANEOUS
  Filled 2016-12-14: qty 1

## 2016-12-14 MED ORDER — DIPHENHYDRAMINE HCL 50 MG/ML IJ SOLN: 12.5000 mg | INTRAMUSCULAR | Status: DC | PRN

## 2016-12-14 MED ORDER — SODIUM CHLORIDE 0.9% FLUSH: 3.0000 mL | INTRAVENOUS | Status: DC | PRN

## 2016-12-14 MED ORDER — DEXTROSE-NACL 5-0.45 % IV SOLN: INTRAVENOUS | Status: DC

## 2016-12-14 MED ORDER — TETANUS-DIPHTH-ACELL PERTUSSIS 5-2.5-18.5 LF-MCG/0.5 IM SUSP: 0.5000 mL | Freq: Once | INTRAMUSCULAR | Status: DC

## 2016-12-14 MED ORDER — KETOROLAC TROMETHAMINE 30 MG/ML IJ SOLN: 30.0000 mg | Freq: Once | INTRAMUSCULAR | Status: DC | PRN

## 2016-12-14 MED ORDER — DIPHENHYDRAMINE HCL 25 MG PO CAPS
25.0000 mg | ORAL_CAPSULE | ORAL | Status: DC | PRN
  Administered 2016-12-15: 25 mg via ORAL
  Filled 2016-12-14: qty 1

## 2016-12-14 MED ORDER — COCONUT OIL OIL: 1.0000 "application " | TOPICAL_OIL | Status: DC | PRN

## 2016-12-14 SURGICAL SUPPLY — 44 items
BENZOIN TINCTURE PRP APPL 2/3 (GAUZE/BANDAGES/DRESSINGS) ×3 IMPLANT
CLAMP CORD UMBIL (MISCELLANEOUS) IMPLANT
CLIP FILSHIE TUBAL LIGA STRL (Clip) ×3 IMPLANT
CLOSURE STERI STRIP 1/2 X4 (GAUZE/BANDAGES/DRESSINGS) ×3 IMPLANT
CLOSURE WOUND 1/2 X4 (GAUZE/BANDAGES/DRESSINGS)
CLOTH BEACON ORANGE TIMEOUT ST (SAFETY) ×3 IMPLANT
DRAIN JACKSON PRT FLT 7MM (DRAIN) ×3 IMPLANT
DRSG OPSITE POSTOP 4X10 (GAUZE/BANDAGES/DRESSINGS) ×3 IMPLANT
DRSG OPSITE POSTOP 4X12 (GAUZE/BANDAGES/DRESSINGS) ×3 IMPLANT
DURAPREP 26ML APPLICATOR (WOUND CARE) ×3 IMPLANT
ELECT REM PT RETURN 9FT ADLT (ELECTROSURGICAL) ×3
ELECTRODE REM PT RTRN 9FT ADLT (ELECTROSURGICAL) ×1 IMPLANT
EVACUATOR SILICONE 100CC (DRAIN) ×3 IMPLANT
EXTRACTOR VACUUM KIWI (MISCELLANEOUS) IMPLANT
GLOVE BIO SURGEON ST LM GN SZ9 (GLOVE) ×3 IMPLANT
GLOVE BIOGEL PI IND STRL 7.0 (GLOVE) ×1 IMPLANT
GLOVE BIOGEL PI IND STRL 9 (GLOVE) ×1 IMPLANT
GLOVE BIOGEL PI INDICATOR 7.0 (GLOVE) ×2
GLOVE BIOGEL PI INDICATOR 9 (GLOVE) ×2
GOWN STRL REUS W/TWL 2XL LVL3 (GOWN DISPOSABLE) ×3 IMPLANT
GOWN STRL REUS W/TWL LRG LVL3 (GOWN DISPOSABLE) ×6 IMPLANT
HOVERMATT SINGLE USE (MISCELLANEOUS) ×3 IMPLANT
NEEDLE HYPO 25X5/8 SAFETYGLIDE (NEEDLE) IMPLANT
NS IRRIG 1000ML POUR BTL (IV SOLUTION) ×3 IMPLANT
PACK C SECTION WH (CUSTOM PROCEDURE TRAY) ×3 IMPLANT
PAD OB MATERNITY 4.3X12.25 (PERSONAL CARE ITEMS) ×3 IMPLANT
PENCIL SMOKE EVAC W/HOLSTER (ELECTROSURGICAL) ×3 IMPLANT
RTRCTR C-SECT PINK 25CM LRG (MISCELLANEOUS) ×3 IMPLANT
RTRCTR C-SECT PINK 34CM XLRG (MISCELLANEOUS) IMPLANT
SPONGE DRAIN TRACH 4X4 STRL 2S (GAUZE/BANDAGES/DRESSINGS) ×3 IMPLANT
STRIP CLOSURE SKIN 1/2X4 (GAUZE/BANDAGES/DRESSINGS) IMPLANT
SUT MNCRL 0 VIOLET CTX 36 (SUTURE) ×2 IMPLANT
SUT MONOCRYL 0 CTX 36 (SUTURE) ×4
SUT PLAIN 2 0 (SUTURE) ×2
SUT PLAIN ABS 2-0 CT1 27XMFL (SUTURE) ×1 IMPLANT
SUT SILK 0 SH 30 (SUTURE) ×3 IMPLANT
SUT VIC AB 0 CT1 27 (SUTURE) ×2
SUT VIC AB 0 CT1 27XBRD ANBCTR (SUTURE) ×1 IMPLANT
SUT VIC AB 2-0 CT1 27 (SUTURE) ×4
SUT VIC AB 2-0 CT1 TAPERPNT 27 (SUTURE) ×2 IMPLANT
SUT VIC AB 4-0 KS 27 (SUTURE) ×3 IMPLANT
SYR BULB IRRIGATION 50ML (SYRINGE) IMPLANT
TOWEL OR 17X24 6PK STRL BLUE (TOWEL DISPOSABLE) ×3 IMPLANT
TRAY FOLEY BAG SILVER LF 14FR (SET/KITS/TRAYS/PACK) ×3 IMPLANT

## 2016-12-14 NOTE — Brief Op Note (Signed)
12/14/2016  11:34 AM  PATIENT:  Sherry Flores  36 y.o. female  PRE-OPERATIVE DIAGNOSIS:  PREGNANCY 39 WEEKS repeat c/s with bilateral tubal and scar revision  POST-OPERATIVE DIAGNOSIS:  repeat c/s with bilateral tubal and scar revision  PROCEDURE:  Procedure(s): CESAREAN SECTION WITH BILATERAL TUBAL LIGATION (Bilateral) Drain placement Jackson-Pratt, subcutaneous  SURGEON:  Surgeon(s) and Role:    * Tilda BurrowFerguson, Dae Antonucci V, MD - Primary  PHYSICIAN ASSISTANT:   ASSISTANTS: none   ANESTHESIA:   spinal  EBL:  Total I/O In: 2000 [I.V.:2000] Out: 900 [Urine:200; Blood:700]  BLOOD ADMINISTERED:none  DRAINS: Urinary Catheter (Foley)   LOCAL MEDICATIONS USED:     and NONE  SPECIMEN:  Source of Specimen:  Placenta delivered and delivery sent to labor and delivery  DISPOSITION OF SPECIMEN:  N/A  COUNTS:  YES  TOURNIQUET:  * No tourniquets in log *  DICTATION: .Dragon Dictation  PLAN OF CARE: Admit to inpatient   PATIENT DISPOSITION:  PACU - hemodynamically stable.   Delay start of Pharmacological VTE agent (>24hrs) due to surgical blood loss or risk of bleeding: not applicable Indications: Repeat cesarean section and tubal ligation at 39 weeks and patient whose pregnancy had A2 diabetes, two-vessel cord propria fetal growth and borderline gestational hypertension.  Details of procedure: Patient was taken to the operating room prepped and draped for lower abdominal surgery, with marking of the old cicatrix removing a 4 cm wide by 30 cm ellipse of skin. Timeout was conducted and Ancef administered procedure confirmed confirmed by surgical team. Transverse lower abdominal incision was made along the previously demarcated skin lines removed and skin in the fatty tissue with the fibrotic tissue. The fascia was then identified beneath the fibrosis, transverse incision made and sharp dissection removed and the fascia off the underlying muscles. Peritoneum was entered the midline. There  was good clean abdominal wall. No adhesions to the anterior abdominal wall. Bladder was flap was quite high and bladder flap was taken down. Thin lower uterine segment was opened transversely and fetal vertex delivered into the incision, and expelled by fundal pressure. Infant was a healthy female infant Apgars 9 and 9 please see the pediatrician's notes cord was clamped at 1 minute. Placenta delivered and response to IV oxytocin and crit day uterine massage followed by closure of the uterine incision and 2 layer fashion, using 0 Monocryl continuous running locking first layer and continuous running second layer of 0 Monocryl. Tubal ligation: Fallopian tubes could be identified on each side to their fimbriated end, and a Filshie clip was placed at the midportion of each tube laxity 4-5 cm from the uterine cornu Hemostasis was confirmed. Abdomen was irrigated. The uterus had been irrigated. Alexis wound retractor was r the peritoneum was closed with running 2-0 Vicryl. Some bleeding on the underside of the muscles was identified and point cautery used carefully to achieve hemostasis. Peritoneum was then closed  . Marland Kitchen. The remaining fibrous scar tissue in the fatty area was then trimmed off of the fascial edges to restore normal anatomy. Hemostasis was even after point cautery of several small areas of oozing decision was made to place a JP drain. The fascia was then closed with running 0 Vicryl and JP drain was placed, 7 mm JP drain, and allowed to exit through skin incision in the left lower quadrant of the abdominal wall. The subcutaneous fat was then reapproximated with 2 layers of interrupted horizontal mattress sutures of 20 plain with skin edges then easily reapproximated with  continuous running 4-0 Vicryl Steri-Strips were applied benzoin and honeycomb dressing applied. Sponge and needle counts were correct throughout

## 2016-12-14 NOTE — Anesthesia Procedure Notes (Signed)
Spinal  Start time: 12/14/2016 10:19 AM End time: 12/14/2016 10:23 AM Staffing Anesthesiologist: Leilani AbleHATCHETT, [place] Paster Performed: anesthesiologist  Preanesthetic Checklist Completed: patient identified, surgical consent, pre-op evaluation, timeout performed, IV checked, risks and benefits discussed and monitors and equipment checked Spinal Block Patient position: sitting Prep: site prepped and draped and DuraPrep Patient monitoring: heart rate, cardiac monitor, continuous pulse ox and blood pressure Approach: midline Location: L3-4 Injection technique: single-shot Needle Needle type: Pencan  Needle length: 10 cm Needle insertion depth: 6 cm Assessment Sensory level: T4

## 2016-12-14 NOTE — Anesthesia Postprocedure Evaluation (Signed)
Anesthesia Post Note  Patient: Sherry CovertJessica L Flores  Procedure(s) Performed: Procedure(s) (LRB): CESAREAN SECTION WITH BILATERAL TUBAL LIGATION (Bilateral)     Patient location during evaluation: PACU Anesthesia Type: Spinal Level of consciousness: awake Pain management: pain level controlled Vital Signs Assessment: post-procedure vital signs reviewed and stable Respiratory status: spontaneous breathing Cardiovascular status: stable Postop Assessment: no headache, no backache, spinal receding, patient able to bend at knees and no signs of nausea or vomiting    Last Vitals:  Vitals:   12/14/16 1216 12/14/16 1217  BP:    Pulse: (!) 52 61  Resp: 17 14  Temp:      Last Pain:  Vitals:   12/14/16 1215  TempSrc: Oral  PainSc: 0-No pain   Pain Goal:                 Aravind Chrismer JR,JOHN Tziporah Knoke

## 2016-12-14 NOTE — Anesthesia Preprocedure Evaluation (Signed)
Anesthesia Evaluation  Patient identified by MRN, date of birth, ID band Patient awake    Reviewed: Allergy & Precautions, H&P , NPO status , Patient's Chart, lab work & pertinent test results  Airway Mallampati: II  TM Distance: >3 FB Neck ROM: full    Dental no notable dental hx. (+) Teeth Intact   Pulmonary former smoker,    Pulmonary exam normal breath sounds clear to auscultation       Cardiovascular hypertension, Normal cardiovascular exam Rhythm:Regular Rate:Normal     Neuro/Psych negative neurological ROS  negative psych ROS   GI/Hepatic negative GI ROS, Neg liver ROS,   Endo/Other  diabetes, Gestational, Insulin Dependent, Oral Hypoglycemic AgentsMorbid obesity  Renal/GU negative Renal ROS     Musculoskeletal   Abdominal (+) + obese,   Peds  Hematology negative hematology ROS (+)   Anesthesia Other Findings   Reproductive/Obstetrics (+) Pregnancy                             Anesthesia Physical Anesthesia Plan  ASA: III  Anesthesia Plan: Spinal   Post-op Pain Management:    Induction:   PONV Risk Score and Plan: 3 and Ondansetron, Dexamethasone, Propofol and Midazolam  Airway Management Planned: Natural Airway and Nasal Cannula  Additional Equipment:   Intra-op Plan:   Post-operative Plan:   Informed Consent: I have reviewed the patients History and Physical, chart, labs and discussed the procedure including the risks, benefits and alternatives for the proposed anesthesia with the patient or authorized representative who has indicated his/her understanding and acceptance.     Plan Discussed with: CRNA and Surgeon  Anesthesia Plan Comments:         Anesthesia Quick Evaluation

## 2016-12-14 NOTE — H&P (Signed)
Sherry CovertJessica L Flores is a 36 y.o. female presenting for Repeat cesarean Section and bilateral tubal Ligation. Prenatal course was notable for a 2-VC with the baby remaining stable throughout.. OB History    Gravida Para Term Preterm AB Living   3 2 2     2    SAB TAB Ectopic Multiple Live Births           2     Past Medical History:  Diagnosis Date  . Gestational diabetes   . Pre-eclampsia   . UTI (lower urinary tract infection)    Past Surgical History:  Procedure Laterality Date  . CESAREAN SECTION     Family History: family history includes Aneurysm in her paternal grandfather; Dementia in her maternal grandfather; Diabetes in her mother. Social History:  reports that she has quit smoking. Her smoking use included Cigarettes. She quit after 2.00 years of use. She has quit using smokeless tobacco. She reports that she does not drink alcohol or use drugs.     Maternal Diabetes: Yes:  Diabetes Type:  Diet controlled then Glyburide Genetic Screening: Normal Maternal Ultrasounds/Referrals: Abnormal:  Findings:   Other: 2 vessel cord  Fetal Ultrasounds or other Referrals:  None Maternal Substance Abuse:  No Significant Maternal Medications:  Meds include: Other: glyburide Significant Maternal Lab Results:  Lab values include: Group B Strep negative Other Comments:  CBG fasting this a.m. 107  ROS History   Blood pressure (!) 138/94, pulse 73, temperature 97.9 F (36.6 C), temperature source Oral, resp. rate 18, last menstrual period 02/27/2016. Exam Physical Exam  Prenatal labs: ABO, Rh: --/--/A POS (06/22 [id number]) Antibody: PENDING (06/22 [id number]) Rubella: 1.51 (11/30 1520) RPR: Non Reactive (06/21 0950)  HBsAg: Negative (11/30 1520)  HIV: Non Reactive (04/04 0843)  GBS:     Assessment/Plan: Pregnancy 39 wk, prior cesarean x 2, Gest Diabetes A2 on Glyburide, Desire for permanent Sterilization.  Plan Repeat Cesarean Section , Bilateral Tubal Ligation,  Will excise old cicatrix  on way in for access improvement.   Rachna Schonberger V 12/14/2016, 8:45 AM

## 2016-12-14 NOTE — Op Note (Signed)
Please see the brief operative note for surgical details 

## 2016-12-14 NOTE — Transfer of Care (Signed)
Immediate Anesthesia Transfer of Care Note  Patient: Sherry CovertJessica L Flores  Procedure(s) Performed: Procedure(s): CESAREAN SECTION WITH BILATERAL TUBAL LIGATION (Bilateral)  Patient Location: PACU  Anesthesia Type:Spinal  Level of Consciousness: awake, alert  and oriented  Airway & Oxygen Therapy: Patient Spontanous Breathing  Post-op Assessment: Report given to RN and Post -op Vital signs reviewed and stable  Post vital signs: Reviewed and stable  Last Vitals:  Vitals:   12/14/16 0642  BP: (!) 138/94  Pulse: 73  Resp: 18  Temp: 36.6 C    Last Pain:  Vitals:   12/14/16 0642  TempSrc: Oral  PainSc: 0-No pain         Complications: No apparent anesthesia complications

## 2016-12-15 DIAGNOSIS — O24425 Gestational diabetes mellitus in childbirth, controlled by oral hypoglycemic drugs: Secondary | ICD-10-CM

## 2016-12-15 DIAGNOSIS — Z3A39 39 weeks gestation of pregnancy: Secondary | ICD-10-CM

## 2016-12-15 DIAGNOSIS — O34211 Maternal care for low transverse scar from previous cesarean delivery: Secondary | ICD-10-CM

## 2016-12-15 LAB — CBC
HCT: 35.4 % — ABNORMAL LOW (ref 36.0–46.0)
HEMOGLOBIN: 11.9 g/dL — AB (ref 12.0–15.0)
MCH: 29.8 pg (ref 26.0–34.0)
MCHC: 33.6 g/dL (ref 30.0–36.0)
MCV: 88.7 fL (ref 78.0–100.0)
PLATELETS: 192 10*3/uL (ref 150–400)
RBC: 3.99 MIL/uL (ref 3.87–5.11)
RDW: 14.4 % (ref 11.5–15.5)
WBC: 11 10*3/uL — AB (ref 4.0–10.5)

## 2016-12-15 LAB — GLUCOSE, CAPILLARY
GLUCOSE-CAPILLARY: 79 mg/dL (ref 65–99)
GLUCOSE-CAPILLARY: 94 mg/dL (ref 65–99)
Glucose-Capillary: 79 mg/dL (ref 65–99)

## 2016-12-15 NOTE — Lactation Note (Signed)
This note was copied from a baby's chart. Lactation Consultation Note Mom's 3rd child. Has 413 yr old, didn't BF. A 35 yr old that she BF for 4 weeks. Mom has good everted nipples. Hand expressed colostrum noted.  Discussed BF positions, STS, I&O, cluster feeding, supply and demand.  Mom wants to pump at some point to obtain storage, discussed pumping.  Mom encouraged to feed baby 8-12 times/24 hours and with feeding cues. Mom encouraged to waken baby for feeds.  Encouraged to call for assistance or questions. [facility] brochure given w/resources, support groups and LC services. Patient Name: Sherry Bufford SpikesJessica Pellicane UEAVW'UToday's Date: 12/15/2016 Reason for consult: Initial assessment   Maternal Data Has patient been taught Hand Expression?: Yes Does the patient have breastfeeding experience prior to this delivery?: Yes  Feeding    LATCH Score/Interventions       Type of Nipple: Everted at rest and after stimulation  Comfort (Breast/Nipple): Soft / non-tender     Intervention(s): Breastfeeding basics reviewed;Support Pillows;Position options;Skin to skin     Lactation Tools Discussed/Used WIC Program: No   Consult Status Consult Status: Follow-up Date: 12/15/16 Follow-up type: In-patient    Luis Nickles, Diamond NickelLAURA G 12/15/2016, 4:14 AM

## 2016-12-15 NOTE — Anesthesia Postprocedure Evaluation (Signed)
Anesthesia Post Note  Patient: Sherry Flores  Procedure(s) Performed: Procedure(s) (LRB): CESAREAN SECTION WITH BILATERAL TUBAL LIGATION (Bilateral)     Patient location during evaluation: Mother Baby Anesthesia Type: Spinal Level of consciousness: awake and alert and oriented Pain management: satisfactory to patient Vital Signs Assessment: post-procedure vital signs reviewed and stable Respiratory status: spontaneous breathing and nonlabored ventilation Cardiovascular status: stable Postop Assessment: no headache, no backache, patient able to bend at knees, no signs of nausea or vomiting and adequate PO intake Anesthetic complications: no    Last Vitals:  Vitals:   12/15/16 0553 12/15/16 0654  BP: (!) 90/52 94/64  Pulse: (!) 51   Resp: 16   Temp: 36.6 C     Last Pain:  Vitals:   12/15/16 0549  TempSrc:   PainSc: 0-No pain   Pain Goal:                 Madison HickmanGREGORY,Shavontae Gibeault

## 2016-12-15 NOTE — Progress Notes (Signed)
Patient ID: Sherry Flores, female   DOB: [birth year], 36 y.o.   MRN: [id number]  POSTPARTUM PROGRESS NOTE  Post Op/Partum Day #1 Subjective:  Sherry Flores is a 36 y.o. G3P2002 4193w1d s/p RLTCS.  No acute events overnight.  Pt denies problems with ambulating, voiding or po intake.  She denies nausea or vomiting.  Pain is well controlled.  She has had flatus. She has not had bowel movement.  Lochia Small.   Objective: Blood pressure 109/68, pulse (!) 56, temperature 97.8 F (36.6 C), temperature source Axillary, resp. rate 18, last menstrual period 02/27/2016, SpO2 100 %.  Physical Exam:  General: alert, cooperative and no distress Lochia:normal flow Chest: CTAB Heart: RRR no m/r/g Abdomen: +BS, soft, nontender, nondistended; Incision is clean/dry/intact Uterine Fundus: firm, below umbilicus DVT Evaluation: No calf swelling or tenderness Extremities: Trace edema   Recent Labs  12/14/16 0712 12/15/16 0530  HGB 13.7 11.9*  HCT 40.2 35.4*    Assessment/Plan:  ASSESSMENT: Sherry Flores is a 36 y.o. [id number] 5193w1d s/p RLTCS w/ BTL  Plan for discharge tomorrow, Breastfeeding and Contraception BTL   LOS: 1 day   Jen MowElizabeth Shavelle Runkel, DO OB Fellow Center for [facility], [facility]  12/15/2016, 11:41 AM

## 2016-12-15 NOTE — Plan of Care (Signed)
Problem: Activity: Goal: Will verbalize the importance of balancing activity with adequate rest periods Outcome: Completed/Met Date Met: 12/15/16 Discussed with patient the importance of increasing ambulation and ambulating in hallway today at least three times.    

## 2016-12-15 NOTE — Lactation Note (Signed)
This note was copied from a baby's chart. Lactation Consultation Note  Patient Name: Sherry Bufford SpikesJessica Flores GNFAO'ZToday's Date: 12/15/2016 Reason for consult: Follow-up assessment   Follow up with mom of 32 hour old infant. infant with 10 BF for 10-30 minutes, 1 void and 4 stools in 24 hours preceding this assessment. Mom reports she feels infant is latching well. She is experiencing nipple pain with initial latch that improves with feeding. She does not feel her breasts feel different at this time. Reviewed applying EBM to nipples post BF. Mom without questions/concerns at this time. She declined need for BF assistance at this time.    Maternal Data Formula Feeding for Exclusion: No Has patient been taught Hand Expression?: Yes  Feeding Feeding Type: Breast Fed Length of feed: 10 min  LATCH Score/Interventions Latch: Grasps breast easily, tongue down, lips flanged, rhythmical sucking.  Audible Swallowing: Spontaneous and intermittent  Type of Nipple: Everted at rest and after stimulation  Comfort (Breast/Nipple): Soft / non-tender     Hold (Positioning): No assistance needed to correctly position infant at breast.  LATCH Score: 10  Lactation Tools Discussed/Used     Consult Status Consult Status: Follow-up Date: 12/16/16 Follow-up type: In-patient    Silas FloodSharon S Elkin Belfield 12/15/2016, 7:01 PM

## 2016-12-15 NOTE — Addendum Note (Signed)
Addendum  created 12/15/16 0900 by Raylan Hanton M, CRNA   Sign clinical note    

## 2016-12-16 MED ORDER — DOCUSATE SODIUM 100 MG PO CAPS: 100.0000 mg | ORAL_CAPSULE | Freq: Two times a day (BID) | ORAL | 0 refills | Status: DC

## 2016-12-16 MED ORDER — OXYCODONE-ACETAMINOPHEN 5-325 MG PO TABS: 1.0000 | ORAL_TABLET | ORAL | 0 refills | Status: DC | PRN

## 2016-12-16 MED ORDER — IBUPROFEN 600 MG PO TABS: 600.0000 mg | ORAL_TABLET | Freq: Four times a day (QID) | ORAL | 0 refills | Status: DC

## 2016-12-16 NOTE — Discharge Instructions (Signed)

## 2016-12-16 NOTE — Lactation Note (Signed)
This note was copied from a baby's chart. Lactation Consultation Note: Mother inquiring when her milk would come in. Informed mother that around 72 hours after delivery. Mother reports that she sees infant swallow but has not heard any swallows. Mother reports that she sees a lot of colostrum when hand expresses. Mother advised to cue base feed and allow infant to cluster feed. Mother has electric pump at home. Advised mother to hand express before and after each feeding and post pump several times daily to increase volume. Mother receptive to all teaching. Encouraged mother to phone with any questions or concerns.   Patient Name: Sherry Bufford SpikesJessica Marano RUEAV'WToday's Date: 12/16/2016 Reason for consult: Follow-up assessment   Maternal Data    Feeding    LATCH Score/Interventions                      Lactation Tools Discussed/Used     Consult Status Consult Status: Complete    Michel BickersKendrick, Cobie Leidner McCoy 12/16/2016, 3:48 PM

## 2016-12-16 NOTE — Discharge Summary (Signed)
OB Discharge Summary     Patient Name: Sherry Flores DOB: 03/21/1981 MRN: [id number]  Date of admission: 12/14/2016 Delivering MD: Tilda Burrow   Date of discharge: 12/16/2016  Admitting diagnosis: PREGNANCY 39 WEEKS Intrauterine pregnancy: 102w2d     Secondary diagnosis:  Active Problems:   History of 2 cesarean sections   Two vessel umbilical cord, antepartum   Gestational diabetes mellitus, class A2   Gestational hypertension   Status post repeat low transverse cesarean section  Additional problems: Undesired fertility     Discharge diagnosis: Term Pregnancy Delivered, GDM A2 and Undesired fertility with tubal ligation                                                                                                Post partum procedures:None  Augmentation: none  Complications: None  Hospital course:  Sceduled C/S   36 y.o. yo G3P2002 at [redacted]w[redacted]d was admitted to the hospital 12/14/2016 for scheduled cesarean section with the following indication:Elective Repeat.  Membrane Rupture Time/Date: 10:47 AM ,12/14/2016   Patient delivered a Viable infant.12/14/2016  Tubal ligation was performed. Details of operation can be found in separate operative note.  Patient had an uncomplicated postpartum course.  She is ambulating, tolerating a regular diet, passing flatus, and urinating well. Incision is clean, dry, intact, stable.  Patient is discharged home in stable condition on  12/23/16         Physical exam  Vitals:   12/15/16 1000 12/15/16 1800 12/16/16 0527 12/16/16 1004  BP: (!) 108/53 109/68 (!) 115/59 130/72  Pulse: (!) 57 (!) 56 63 (!) 56  Resp: 16 18 18 20   Temp: 98.1 F (36.7 C) 97.8 F (36.6 C) 98 F (36.7 C) 98.7 F (37.1 C)  TempSrc: Oral Axillary Oral Oral  SpO2: 99% 100% 100% 98%   General: alert, cooperative and no distress Lochia: appropriate Uterine Fundus: firm Incision: Healing well with no significant drainage, No significant erythema, Dressing is  clean, dry, and intact DVT Evaluation: No evidence of DVT seen on physical exam. Negative Homan's sign. No cords or calf tenderness. No significant calf/ankle edema. Labs: Lab Results  Component Value Date   WBC 11.0 (H) 12/15/2016   HGB 11.9 (L) 12/15/2016   HCT 35.4 (L) 12/15/2016   MCV 88.7 12/15/2016   PLT 192 12/15/2016   CMP Latest Ref Rng & Units 12/13/2016  Glucose 65 - 99 mg/dL 86  BUN 6 - 20 mg/dL 15  Creatinine 4.09 - 8.11 mg/dL 9.14  Sodium 782 - 956 mmol/L 136  Potassium 3.5 - 5.1 mmol/L 4.3  Chloride 101 - 111 mmol/L 109  CO2 22 - 32 mmol/L 17(L)  Calcium 8.9 - 10.3 mg/dL 9.6  Total Protein 6.5 - 8.1 g/dL 6.1(L)  Total Bilirubin 0.3 - 1.2 mg/dL 0.6  Alkaline Phos 38 - 126 U/L 142(H)  AST 15 - 41 U/L 37  ALT 14 - 54 U/L 40    Discharge instruction: per After Visit Summary and "Baby and Me Booklet".  After visit meds:  Allergies as of 12/16/2016   No Known Allergies  Medication List    STOP taking these medications   aspirin EC 81 MG tablet   glyBURIDE 2.5 MG tablet Commonly known as:  DIABETA   insulin NPH Human 100 UNIT/ML injection Commonly known as:  HUMULIN N,NOVOLIN N   Insulin Syringes (Disposable) U-100 0.3 ML Misc   PRENATAL PO     TAKE these medications   CITRANATAL HARMONY 27-1-260 MG Caps Take daily   docusate sodium 100 MG capsule Commonly known as:  COLACE Take 1 capsule (100 mg total) by mouth 2 (two) times daily.   ibuprofen 600 MG tablet Commonly known as:  ADVIL,MOTRIN Take 1 tablet (600 mg total) by mouth every 6 (six) hours.   oxyCODONE-acetaminophen 5-325 MG tablet Commonly known as:  ROXICET Take 1-2 tablets by mouth every 4 (four) hours as needed for severe pain.       Diet: carb modified diet  Activity: Advance as tolerated. Pelvic rest for 6 weeks.   Outpatient follow up:2 weeks Follow up Appt:Future Appointments Date Time Provider Department Center  12/21/2016 9:15 AM Lazaro ArmsEure, Luther H, MD [facility]  [facility]   Follow up Visit:No Follow-up on file.  Postpartum contraception: Tubal Ligation  Newborn Data: Live born female  Birth Weight: 6 lb 13.7 oz (3110 g) APGAR: 8,   Baby Feeding: Breast Disposition:home with mother   12/16/2016 Jen MowElizabeth Mumaw, DO

## 2016-12-18 ENCOUNTER — Telehealth: Payer: Self-pay | Admitting: *Deleted

## 2016-12-18 NOTE — Telephone Encounter (Signed)
Husband called asking when honeycomb dressing could be removed. Informed anytime between now and before her next appointment was fine. Best to remove when in shower and to let warm soapy water run down over steri strips. Pat dry and she could use a pad to keep pant from rubbing incision. Verbalized understanding.

## 2016-12-21 ENCOUNTER — Encounter: Payer: Self-pay | Admitting: Obstetrics & Gynecology

## 2016-12-21 ENCOUNTER — Ambulatory Visit (INDEPENDENT_AMBULATORY_CARE_PROVIDER_SITE_OTHER): Payer: 59 | Admitting: Obstetrics & Gynecology

## 2016-12-21 VITALS — BP 124/76 | HR 70 | Wt 226.0 lb

## 2016-12-21 DIAGNOSIS — Z98891 History of uterine scar from previous surgery: Secondary | ICD-10-CM

## 2016-12-21 NOTE — Progress Notes (Signed)
  HPI: Patient returns for routine postoperative follow-up having undergone repeat Caesarean section with BTL on 12/14/2016.  The patient's immediate postoperative recovery has been unremarkable. Since hospital discharge the patient reports no problems, no drainage.   Current Outpatient Prescriptions: docusate sodium (COLACE) 100 MG capsule, Take 1 capsule (100 mg total) by mouth 2 (two) times daily., Disp: 30 capsule, Rfl: 0 ibuprofen (ADVIL,MOTRIN) 600 MG tablet, Take 1 tablet (600 mg total) by mouth every 6 (six) hours., Disp: 30 tablet, Rfl: 0 oxyCODONE-acetaminophen (ROXICET) 5-325 MG tablet, Take 1-2 tablets by mouth every 4 (four) hours as needed for severe pain., Disp: 30 tablet, Rfl: 0 Prenat-FeFmCb-DSS-FA-DHA w/o A (CITRANATAL HARMONY) 27-1-260 MG CAPS, Take daily, Disp: 60 capsule, Rfl: 11  No current facility-administered medications for this visit.     Blood pressure 124/76, pulse 70, weight 226 lb (102.5 kg), last menstrual period 02/27/2016, currently breastfeeding.  Physical Exam: Incision clean dry intact Steri strips removed  Diagnostic Tests:   Pathology: benign  Impression: S/p repeat section with BTL  Plan:   Follow up: 4  weeks pp visit  Lazaro ArmsEURE,LUTHER H, MD

## 2017-01-17 ENCOUNTER — Ambulatory Visit (INDEPENDENT_AMBULATORY_CARE_PROVIDER_SITE_OTHER): Payer: 59 | Admitting: Women's Health

## 2017-01-17 ENCOUNTER — Encounter: Payer: Self-pay | Admitting: Women's Health

## 2017-01-17 DIAGNOSIS — Z8632 Personal history of gestational diabetes: Secondary | ICD-10-CM | POA: Insufficient documentation

## 2017-01-17 DIAGNOSIS — Z8759 Personal history of other complications of pregnancy, childbirth and the puerperium: Secondary | ICD-10-CM

## 2017-01-17 DIAGNOSIS — Z98891 History of uterine scar from previous surgery: Secondary | ICD-10-CM

## 2017-01-17 MED ORDER — PANTOPRAZOLE SODIUM 20 MG PO TBEC: 20.0000 mg | DELAYED_RELEASE_TABLET | Freq: Every day | ORAL | 3 refills | Status: DC

## 2017-01-17 NOTE — Patient Instructions (Addendum)
Check blood pressure 2 times a day, if >140 on top or >90 on bottom let me know  Tips To Increase Milk Supply  Lots of water! Enough so that your urine is clear  Plenty of calories, if you're not getting enough calories, your milk supply can decrease  Breastfeed/pump often, every 2-3 hours x 20-7430mins  Fenugreek 3 pills 3 times a day, this may make your urine smell like maple syrup  Mother's Milk Tea  Lactation cookies, google for the recipe  Real oatmeal    You will have your sugar test next visit.  Please do not eat or drink anything after midnight the night before you come, not even water.  You will be here for at least two hours.

## 2017-01-17 NOTE — Progress Notes (Signed)
Subjective:    Sherry Flores is a 36 y.o. 273P2002 Caucasian female who presents for a postpartum visit. She is 5 weeks postpartum following a repeat cesarean section, low transverse incision, BTL, and scar revision at 39 gestational weeks. Anesthesia: spinal. I have fully reviewed the prenatal and intrapartum course. Pregnancy complicated by A2DM and GHTN. Postpartum course has been uncomplicated. Baby's course has been uncomplicated. Baby is feeding by breast. Bleeding no bleeding. Bowel function is normal. Bladder function is normal. Patient is not sexually active. Last sexual activity: prior to birth of baby. Contraception method is tubal ligation. Postpartum depression screening: negative. Score 1.  [place] was neg. Some reflux/indigestion, more at night, TUMS helps minimally.   The following portions of the patient's history were reviewed and updated as appropriate: allergies, current medications, past medical history, past surgical history and problem list.  Review of Systems Pertinent items are noted in HPI.   Vitals:   01/17/17 0857  BP: 120/90  Pulse: 76  Weight: 216 lb (98 kg)  BP recheck 115/80 No LMP recorded.  Objective:   General:  alert, cooperative and no distress   Breasts:  deferred, no complaints  Lungs: clear to auscultation bilaterally  Heart:  regular rate and rhythm  Abdomen: soft, nontender, c/s incision well-healed   Vulva: normal  Vagina: normal vagina  Cervix:  closed  Corpus: Well-involuted  Adnexa:  Non-palpable  Rectal Exam: No hemorrhoids        Assessment:   Postpartum exam 5 wks s/p RLTCS w/ BTL, scar revision Breastfeeding A2DM during pregnancy GHTN during pregnancy Depression screening Contraception counseling   Plan:  Contraception: tubal ligation  Gave tips to increase milk supply Check bp's at home bid, if elevated let us know Rx protonix 20mg  daily, let us know if not helping Follow up in: 7 weeks for repeat  2hr GTT (no visit), then Dec for physical, or earlier if needed  Marge DuncansBooker, Thelma Viana Randall CNM, [facility]NP-BC 01/17/2017 9:15 AM

## 2017-02-01 ENCOUNTER — Encounter: Payer: Self-pay | Admitting: Women's Health

## 2017-02-27 DIAGNOSIS — R1011 Right upper quadrant pain: Secondary | ICD-10-CM | POA: Diagnosis not present

## 2017-02-27 DIAGNOSIS — Z6837 Body mass index (BMI) 37.0-37.9, adult: Secondary | ICD-10-CM | POA: Diagnosis not present

## 2017-03-01 ENCOUNTER — Other Ambulatory Visit (HOSPITAL_COMMUNITY): Payer: Self-pay | Admitting: Family Medicine

## 2017-03-01 DIAGNOSIS — R1011 Right upper quadrant pain: Secondary | ICD-10-CM

## 2017-03-06 ENCOUNTER — Ambulatory Visit (HOSPITAL_COMMUNITY)
Admission: RE | Admit: 2017-03-06 | Discharge: 2017-03-06 | Disposition: A | Discharge status: Home / Self Care | Payer: 59 | Source: Ambulatory Visit | Attending: Family Medicine | Admitting: Family Medicine

## 2017-03-06 DIAGNOSIS — K8080 Other cholelithiasis without obstruction: Secondary | ICD-10-CM | POA: Insufficient documentation

## 2017-03-06 DIAGNOSIS — R1011 Right upper quadrant pain: Secondary | ICD-10-CM | POA: Diagnosis present

## 2017-03-06 DIAGNOSIS — N200 Calculus of kidney: Secondary | ICD-10-CM | POA: Insufficient documentation

## 2017-03-06 DIAGNOSIS — K8020 Calculus of gallbladder without cholecystitis without obstruction: Secondary | ICD-10-CM | POA: Diagnosis not present

## 2017-03-07 ENCOUNTER — Other Ambulatory Visit: Payer: Self-pay

## 2017-03-11 ENCOUNTER — Other Ambulatory Visit: Payer: 59

## 2017-03-11 DIAGNOSIS — Z8632 Personal history of gestational diabetes: Secondary | ICD-10-CM | POA: Diagnosis not present

## 2017-03-12 LAB — GLUCOSE TOLERANCE, 2 HOURS W/ 1HR
Glucose, 1 hour: 149 mg/dL (ref 65–179)
Glucose, 2 hour: 140 mg/dL (ref 65–152)
Glucose, Fasting: 82 mg/dL (ref 65–91)

## 2017-03-28 ENCOUNTER — Ambulatory Visit (INDEPENDENT_AMBULATORY_CARE_PROVIDER_SITE_OTHER): Payer: 59 | Admitting: General Surgery

## 2017-03-28 ENCOUNTER — Encounter: Payer: Self-pay | Admitting: General Surgery

## 2017-03-28 ENCOUNTER — Ambulatory Visit: Payer: Self-pay | Admitting: General Surgery

## 2017-03-28 VITALS — BP 140/83 | HR 61 | Temp 98.7°F | Resp 18 | Ht 64.0 in | Wt 214.0 lb

## 2017-03-28 DIAGNOSIS — K8020 Calculus of gallbladder without cholecystitis without obstruction: Secondary | ICD-10-CM | POA: Diagnosis not present

## 2017-03-28 NOTE — Progress Notes (Signed)
Sherry Flores; [id number]; 02/16/1981   HPI   Patient is a 35-year-old white female who was referred to my care by Dr. Daniel for evaluation and treatment of gallbladder disease.  Patient was found on ultrasound the gallbladder to have a single stone.  She has had several episodes of right upper quadrant abdominal pain with radiation to the right flank and back, nausea, and bloating over the past few weeks.  She states she currently has no pain.  She does have mild fatty food intolerance.  She is breast-feeding as she is recently postpartum.  She denies any fever, chills, or jaundice. Past Medical History:  Diagnosis Date  . Gestational diabetes   . Pre-eclampsia   . UTI (lower urinary tract infection)     Past Surgical History:  Procedure Laterality Date  . CESAREAN SECTION    . CESAREAN SECTION WITH BILATERAL TUBAL LIGATION Bilateral 12/14/2016   Procedure: CESAREAN SECTION WITH BILATERAL TUBAL LIGATION;  Surgeon: Ferguson, John V, MD;  Location: [facility];  Service: Obstetrics;  Laterality: Bilateral;    Family History  Problem Relation Age of Onset  . Aneurysm Paternal Grandfather        brain  . Dementia Maternal Grandfather   . Diabetes Mother     Current Outpatient Prescriptions on File Prior to Visit  Medication Sig Dispense Refill  . docusate sodium (COLACE) 100 MG capsule Take 1 capsule (100 mg total) by mouth 2 (two) times daily. (Patient not taking: Reported on 01/17/2017) 30 capsule 0  . ibuprofen (ADVIL,MOTRIN) 600 MG tablet Take 1 tablet (600 mg total) by mouth every 6 (six) hours. (Patient not taking: Reported on 01/17/2017) 30 tablet 0  . oxyCODONE-acetaminophen (ROXICET) 5-325 MG tablet Take 1-2 tablets by mouth every 4 (four) hours as needed for severe pain. (Patient not taking: Reported on 01/17/2017) 30 tablet 0  . pantoprazole (PROTONIX) 20 MG tablet Take 1 tablet (20 mg total) by mouth daily. 30 tablet 3  . Prenat-FeFmCb-DSS-FA-DHA w/o A (CITRANATAL  HARMONY) 27-1-260 MG CAPS Take daily 60 capsule 11   No current facility-administered medications on file prior to visit.     No Known Allergies  History  Alcohol Use No    History  Smoking Status  . Former Smoker  . Years: 2.00  . Types: Cigarettes  Smokeless Tobacco  . Former User    Review of Systems  Constitutional: Negative.   HENT: Negative.   Eyes: Negative.   Respiratory: Negative.   Cardiovascular: Negative.   Gastrointestinal: Positive for abdominal pain, heartburn, nausea and vomiting.  Genitourinary: Negative.   Musculoskeletal: Negative.   Skin: Negative.   Neurological: Negative.   Endo/Heme/Allergies: Negative.   Psychiatric/Behavioral: Negative.     Objective   Vitals:   03/28/17 1130  BP: 140/83  Pulse: 61  Resp: 18  Temp: 98.7 F (37.1 C)    Physical Exam  Constitutional: She is oriented to person, place, and time and well-developed, well-nourished, and in no distress.  HENT:  Head: Normocephalic and atraumatic.  Eyes: No scleral icterus.  Cardiovascular: Normal rate, regular rhythm and normal heart sounds.  Exam reveals no gallop and no friction rub.   No murmur heard. Pulmonary/Chest: Effort normal and breath sounds normal. No respiratory distress. She has no wheezes. She has no rales.  Abdominal: Soft. Bowel sounds are normal. She exhibits no distension. There is tenderness. There is no rebound and no guarding.  Neurological: She is alert and oriented to person, place, and time.    Skin: Skin is warm and dry.  Vitals reviewed.    Ultrasound report reviewed Assessment   biliary colic, cholelithiasis Plan   Patient  Will call to schedule laparoscopic cholecystectomy.  Risks and benefits of the procedure including bleeding, infection, hepatobiliary injury, the possibility of an open procedure were fully explained to the patient, who gave informed consent.  

## 2017-03-28 NOTE — Patient Instructions (Signed)

## 2017-03-29 NOTE — Patient Instructions (Signed)
Sherry Flores  03/29/2017     @   Your procedure is scheduled on 04/10/17  Report to [facility] at 0720 A.M.  Call this number if you have problems the morning of surgery:  [phone]   Remember:  Do not eat food or drink liquids after midnight.  Take these medicines the morning of surgery with A SIP OF WATER protonix, roxicet if needed   Do not wear jewelry, make-up or nail polish.  Do not wear lotions, powders, or perfumes, or deoderant.  Do not shave 48 hours prior to surgery.  Men may shave face and neck.  Do not bring valuables to the hospital.  [facility] is not responsible for any belongings or valuables.  Contacts, dentures or bridgework may not be worn into surgery.  Leave your suitcase in the car.  After surgery it may be brought to your room.  For patients admitted to the hospital, discharge time will be determined by your treatment team.  Patients discharged the day of surgery will not be allowed to drive home.   Name and phone number of your driver:   family Special instructions:    Please read over the following fact sheets that you were given. Surgical Site Infection Prevention and Anesthesia Post-op Instructions      PATIENT INSTRUCTIONS POST-ANESTHESIA  IMMEDIATELY FOLLOWING SURGERY:  Do not drive or operate machinery for the first twenty four hours after surgery.  Do not make any important decisions for twenty four hours after surgery or while taking narcotic pain medications or sedatives.  If you develop intractable nausea and vomiting or a severe headache please notify your doctor immediately.  FOLLOW-UP:  Please make an appointment with your surgeon as instructed. You do not need to follow up with anesthesia unless specifically instructed to do so.  WOUND CARE INSTRUCTIONS (if applicable):  Keep a dry clean dressing on the anesthesia/puncture wound site if there is drainage.  Once the wound has quit draining you may leave it  open to air.  Generally you should leave the bandage intact for twenty four hours unless there is drainage.  If the epidural site drains for more than 36-48 hours please call the anesthesia department.  QUESTIONS?:  Please feel free to call your physician or the hospital operator if you have any questions, and they will be happy to assist you.     Laparoscopic Cholecystectomy Laparoscopic cholecystectomy is surgery to remove the gallbladder. The gallbladder is a pear-shaped organ that lies beneath the liver on the right side of the body. The gallbladder stores bile, which is a fluid that helps the body to digest fats. Cholecystectomy is often done for inflammation of the gallbladder (cholecystitis). This condition is usually caused by a buildup of gallstones (cholelithiasis) in the gallbladder. Gallstones can block the flow of bile, which can result in inflammation and pain. In severe cases, emergency surgery may be required. This procedure is done though small incisions in your abdomen (laparoscopic surgery). A thin scope with a camera (laparoscope) is inserted through one incision. Thin surgical instruments are inserted through the other incisions. In some cases, a laparoscopic procedure may be turned into a type of surgery that is done through a larger incision (open surgery). Tell a health care provider about:  Any allergies you have.  All medicines you are taking, including vitamins, herbs, eye drops, creams, and over-the-counter medicines.  Any problems you or family members have had with anesthetic medicines.  Any blood disorders you  have.  Any surgeries you have had.  Any medical conditions you have.  Whether you are pregnant or may be pregnant. What are the risks? Generally, this is a safe procedure. However, problems may occur, including:  Infection.  Bleeding.  Allergic reactions to medicines.  Damage to other structures or organs.  A stone remaining in the common bile  duct. The common bile duct carries bile from the gallbladder into the small intestine.  A bile leak from the cyst duct that is clipped when your gallbladder is removed.  What happens before the procedure? Staying hydrated Follow instructions from your health care provider about hydration, which may include:  Up to 2 hours before the procedure - you may continue to drink clear liquids, such as water, clear fruit juice, black coffee, and plain tea.  Eating and drinking restrictions Follow instructions from your health care provider about eating and drinking, which may include:  8 hours before the procedure - stop eating heavy meals or foods such as meat, fried foods, or fatty foods.  6 hours before the procedure - stop eating light meals or foods, such as toast or cereal.  6 hours before the procedure - stop drinking milk or drinks that contain milk.  2 hours before the procedure - stop drinking clear liquids.  Medicines  Ask your health care provider about: ? Changing or stopping your regular medicines. This is especially important if you are taking diabetes medicines or blood thinners. ? Taking medicines such as aspirin and ibuprofen. These medicines can thin your blood. Do not take these medicines before your procedure if your health care provider instructs you not to.  You may be given antibiotic medicine to help prevent infection. General instructions  Let your health care provider know if you develop a cold or an infection before surgery.  Plan to have someone take you home from the hospital or clinic.  Ask your health care provider how your surgical site will be marked or identified. What happens during the procedure?  To reduce your risk of infection: ? Your health care team will wash or sanitize their hands. ? Your skin will be washed with soap. ? Hair may be removed from the surgical area.  An IV tube may be inserted into one of your veins.  You will be given one  or more of the following: ? A medicine to help you relax (sedative). ? A medicine to make you fall asleep (general anesthetic).  A breathing tube will be placed in your mouth.  Your surgeon will make several small cuts (incisions) in your abdomen.  The laparoscope will be inserted through one of the small incisions. The camera on the laparoscope will send images to a TV screen (monitor) in the operating room. This lets your surgeon see inside your abdomen.  Air-like gas will be pumped into your abdomen. This will expand your abdomen to give the surgeon more room to perform the surgery.  Other tools that are needed for the procedure will be inserted through the other incisions. The gallbladder will be removed through one of the incisions.  Your common bile duct may be examined. If stones are found in the common bile duct, they may be removed.  After your gallbladder has been removed, the incisions will be closed with stitches (sutures), staples, or skin glue.  Your incisions may be covered with a bandage (dressing). The procedure may vary among health care providers and hospitals. What happens after the procedure?  Your blood  pressure, heart rate, breathing rate, and blood oxygen level will be monitored until the medicines you were given have worn off.  You will be given medicines as needed to control your pain.  Do not drive for 24 hours if you were given a sedative. This information is not intended to replace advice given to you by your health care provider. Make sure you discuss any questions you have with your health care provider. Document Released: 06/11/2005 Document Revised: 01/01/2016 Document Reviewed: 11/28/2015 Elsevier Interactive Patient Education  2018 [employer].

## 2017-03-29 NOTE — H&P (Signed)
Sherry Flores; [id number]; 1981-01-08   HPI   Patient is a 36 year old white female who was referred to my care by Dr. Reuel Boom for evaluation and treatment of gallbladder disease.  Patient was found on ultrasound the gallbladder to have a single stone.  She has had several episodes of right upper quadrant abdominal pain with radiation to the right flank and back, nausea, and bloating over the past few weeks.  She states she currently has no pain.  She does have mild fatty food intolerance.  She is breast-feeding as she is recently postpartum.  She denies any fever, chills, or jaundice. Past Medical History:  Diagnosis Date  . Gestational diabetes   . Pre-eclampsia   . UTI (lower urinary tract infection)     Past Surgical History:  Procedure Laterality Date  . CESAREAN SECTION    . CESAREAN SECTION WITH BILATERAL TUBAL LIGATION Bilateral 12/14/2016   Procedure: CESAREAN SECTION WITH BILATERAL TUBAL LIGATION;  Surgeon: Tilda Burrow, MD;  Location: [facility] BIRTHING SUITES;  Service: Obstetrics;  Laterality: Bilateral;    Family History  Problem Relation Age of Onset  . Aneurysm Paternal Grandfather        brain  . Dementia Maternal Grandfather   . Diabetes Mother     Current Outpatient Prescriptions on File Prior to Visit  Medication Sig Dispense Refill  . docusate sodium (COLACE) 100 MG capsule Take 1 capsule (100 mg total) by mouth 2 (two) times daily. (Patient not taking: Reported on 01/17/2017) 30 capsule 0  . ibuprofen (ADVIL,MOTRIN) 600 MG tablet Take 1 tablet (600 mg total) by mouth every 6 (six) hours. (Patient not taking: Reported on 01/17/2017) 30 tablet 0  . oxyCODONE-acetaminophen (ROXICET) 5-325 MG tablet Take 1-2 tablets by mouth every 4 (four) hours as needed for severe pain. (Patient not taking: Reported on 01/17/2017) 30 tablet 0  . pantoprazole (PROTONIX) 20 MG tablet Take 1 tablet (20 mg total) by mouth daily. 30 tablet 3  . Prenat-FeFmCb-DSS-FA-DHA w/o A (CITRANATAL  HARMONY) 27-1-260 MG CAPS Take daily 60 capsule 11   No current facility-administered medications on file prior to visit.     No Known Allergies  History  Alcohol Use No    History  Smoking Status  . Former Smoker  . Years: 2.00  . Types: Cigarettes  Smokeless Tobacco  . Former Neurosurgeon    Review of Systems  Constitutional: Negative.   HENT: Negative.   Eyes: Negative.   Respiratory: Negative.   Cardiovascular: Negative.   Gastrointestinal: Positive for abdominal pain, heartburn, nausea and vomiting.  Genitourinary: Negative.   Musculoskeletal: Negative.   Skin: Negative.   Neurological: Negative.   Endo/Heme/Allergies: Negative.   Psychiatric/Behavioral: Negative.     Objective   Vitals:   03/28/17 1130  BP: 140/83  Pulse: 61  Resp: 18  Temp: 98.7 F (37.1 C)    Physical Exam  Constitutional: She is oriented to person, place, and time and well-developed, well-nourished, and in no distress.  HENT:  Head: Normocephalic and atraumatic.  Eyes: No scleral icterus.  Cardiovascular: Normal rate, regular rhythm and normal heart sounds.  Exam reveals no gallop and no friction rub.   No murmur heard. Pulmonary/Chest: Effort normal and breath sounds normal. No respiratory distress. She has no wheezes. She has no rales.  Abdominal: Soft. Bowel sounds are normal. She exhibits no distension. There is tenderness. There is no rebound and no guarding.  Neurological: She is alert and oriented to person, place, and time.  Skin: Skin is warm and dry.  Vitals reviewed.    Ultrasound report reviewed Assessment   biliary colic, cholelithiasis Plan   Patient  Will call to schedule laparoscopic cholecystectomy.  Risks and benefits of the procedure including bleeding, infection, hepatobiliary injury, the possibility of an open procedure were fully explained to the patient, who gave informed consent.

## 2017-04-03 ENCOUNTER — Encounter (HOSPITAL_COMMUNITY): Payer: Self-pay

## 2017-04-03 ENCOUNTER — Encounter (HOSPITAL_COMMUNITY)
Admission: RE | Admit: 2017-04-03 | Discharge: 2017-04-03 | Disposition: A | Discharge status: Home / Self Care | Payer: 59 | Source: Ambulatory Visit | Attending: General Surgery | Admitting: General Surgery

## 2017-04-03 DIAGNOSIS — Z01818 Encounter for other preprocedural examination: Secondary | ICD-10-CM | POA: Diagnosis not present

## 2017-04-03 HISTORY — DX: Personal history of urinary calculi: Z87.442

## 2017-04-03 HISTORY — DX: Other specified postprocedural states: Z98.890

## 2017-04-03 HISTORY — DX: Gastro-esophageal reflux disease without esophagitis: K21.9

## 2017-04-03 HISTORY — DX: Nausea with vomiting, unspecified: R11.2

## 2017-04-03 LAB — CBC WITH DIFFERENTIAL/PLATELET
BASOS ABS: 0 10*3/uL (ref 0.0–0.1)
BASOS PCT: 0 %
EOS PCT: 10 %
Eosinophils Absolute: 1.1 10*3/uL — ABNORMAL HIGH (ref 0.0–0.7)
HCT: 41.7 % (ref 36.0–46.0)
Hemoglobin: 13.6 g/dL (ref 12.0–15.0)
Lymphocytes Relative: 36 %
Lymphs Abs: 4.1 10*3/uL — ABNORMAL HIGH (ref 0.7–4.0)
MCH: 29 pg (ref 26.0–34.0)
MCHC: 32.6 g/dL (ref 30.0–36.0)
MCV: 88.9 fL (ref 78.0–100.0)
MONO ABS: 0.6 10*3/uL (ref 0.1–1.0)
MONOS PCT: 5 %
Neutro Abs: 5.5 10*3/uL (ref 1.7–7.7)
Neutrophils Relative %: 49 %
PLATELETS: 297 10*3/uL (ref 150–400)
RBC: 4.69 MIL/uL (ref 3.87–5.11)
RDW: 13.9 % (ref 11.5–15.5)
WBC: 11.2 10*3/uL — ABNORMAL HIGH (ref 4.0–10.5)

## 2017-04-03 LAB — HCG, SERUM, QUALITATIVE: PREG SERUM: NEGATIVE

## 2017-04-03 LAB — COMPREHENSIVE METABOLIC PANEL
ALBUMIN: 4.3 g/dL (ref 3.5–5.0)
ALT: 24 U/L (ref 14–54)
ANION GAP: 10 (ref 5–15)
AST: 21 U/L (ref 15–41)
Alkaline Phosphatase: 67 U/L (ref 38–126)
BILIRUBIN TOTAL: 0.4 mg/dL (ref 0.3–1.2)
BUN: 15 mg/dL (ref 6–20)
CHLORIDE: 105 mmol/L (ref 101–111)
CO2: 25 mmol/L (ref 22–32)
Calcium: 9.4 mg/dL (ref 8.9–10.3)
Creatinine, Ser: 0.75 mg/dL (ref 0.44–1.00)
GFR calc Af Amer: >60 mL/min (ref >60)
Glucose, Bld: 81 mg/dL (ref 65–99)
POTASSIUM: 3.9 mmol/L (ref 3.5–5.1)
Sodium: 140 mmol/L (ref 135–145)
TOTAL PROTEIN: 7.1 g/dL (ref 6.5–8.1)

## 2017-04-10 ENCOUNTER — Ambulatory Visit (HOSPITAL_COMMUNITY): Payer: 59 | Admitting: Anesthesiology

## 2017-04-10 ENCOUNTER — Ambulatory Visit (HOSPITAL_COMMUNITY)
Admission: RE | Admit: 2017-04-10 | Discharge: 2017-04-10 | Disposition: A | Discharge status: Home / Self Care | Payer: 59 | Source: Ambulatory Visit | Attending: General Surgery | Admitting: General Surgery

## 2017-04-10 ENCOUNTER — Encounter (HOSPITAL_COMMUNITY): Payer: Self-pay | Admitting: *Deleted

## 2017-04-10 ENCOUNTER — Encounter (HOSPITAL_COMMUNITY)
Admission: RE | Disposition: A | Discharge status: Home / Self Care | Payer: Self-pay | Source: Ambulatory Visit | Attending: General Surgery

## 2017-04-10 DIAGNOSIS — K8064 Calculus of gallbladder and bile duct with chronic cholecystitis without obstruction: Secondary | ICD-10-CM | POA: Diagnosis not present

## 2017-04-10 DIAGNOSIS — K8020 Calculus of gallbladder without cholecystitis without obstruction: Secondary | ICD-10-CM | POA: Diagnosis not present

## 2017-04-10 DIAGNOSIS — Z87891 Personal history of nicotine dependence: Secondary | ICD-10-CM | POA: Diagnosis not present

## 2017-04-10 DIAGNOSIS — E6609 Other obesity due to excess calories: Secondary | ICD-10-CM | POA: Diagnosis not present

## 2017-04-10 HISTORY — PX: CHOLECYSTECTOMY: SHX55

## 2017-04-10 SURGERY — LAPAROSCOPIC CHOLECYSTECTOMY
Anesthesia: General

## 2017-04-10 MED ORDER — DEXAMETHASONE SODIUM PHOSPHATE 4 MG/ML IJ SOLN
4.0000 mg | Freq: Once | INTRAMUSCULAR | Status: AC
  Administered 2017-04-10: 4 mg via INTRAVENOUS
  Filled 2017-04-10: qty 1

## 2017-04-10 MED ORDER — ONDANSETRON HCL 4 MG/2ML IJ SOLN
4.0000 mg | Freq: Once | INTRAMUSCULAR | Status: AC
  Administered 2017-04-10: 4 mg via INTRAVENOUS
  Filled 2017-04-10: qty 2

## 2017-04-10 MED ORDER — POVIDONE-IODINE 10 % EX OINT
TOPICAL_OINTMENT | CUTANEOUS | Status: AC
  Filled 2017-04-10: qty 1

## 2017-04-10 MED ORDER — CIPROFLOXACIN IN D5W 400 MG/200ML IV SOLN
INTRAVENOUS | Status: AC
  Filled 2017-04-10: qty 200

## 2017-04-10 MED ORDER — BUPIVACAINE HCL (PF) 0.5 % IJ SOLN
INTRAMUSCULAR | Status: DC | PRN
  Administered 2017-04-10: 10 mL

## 2017-04-10 MED ORDER — KETOROLAC TROMETHAMINE 30 MG/ML IJ SOLN
30.0000 mg | Freq: Once | INTRAMUSCULAR | Status: AC
  Administered 2017-04-10: 30 mg via INTRAVENOUS

## 2017-04-10 MED ORDER — OXYCODONE-ACETAMINOPHEN 7.5-325 MG PO TABS: 1.0000 | ORAL_TABLET | ORAL | 0 refills | Status: DC | PRN

## 2017-04-10 MED ORDER — BUPIVACAINE HCL (PF) 0.5 % IJ SOLN
INTRAMUSCULAR | Status: AC
  Filled 2017-04-10: qty 30

## 2017-04-10 MED ORDER — SUCCINYLCHOLINE CHLORIDE 20 MG/ML IJ SOLN
INTRAMUSCULAR | Status: DC | PRN
  Administered 2017-04-10: 140 mg via INTRAVENOUS

## 2017-04-10 MED ORDER — FENTANYL CITRATE (PF) 100 MCG/2ML IJ SOLN
INTRAMUSCULAR | Status: AC
  Filled 2017-04-10: qty 2

## 2017-04-10 MED ORDER — KETOROLAC TROMETHAMINE 30 MG/ML IJ SOLN
INTRAMUSCULAR | Status: AC
  Filled 2017-04-10: qty 1

## 2017-04-10 MED ORDER — LIDOCAINE HCL (CARDIAC) 20 MG/ML IV SOLN
INTRAVENOUS | Status: DC | PRN
  Administered 2017-04-10: 40 mg via INTRAVENOUS

## 2017-04-10 MED ORDER — FENTANYL CITRATE (PF) 100 MCG/2ML IJ SOLN
INTRAMUSCULAR | Status: DC | PRN
  Administered 2017-04-10 (×4): 50 ug via INTRAVENOUS

## 2017-04-10 MED ORDER — SUGAMMADEX SODIUM 500 MG/5ML IV SOLN
INTRAVENOUS | Status: DC | PRN
  Administered 2017-04-10: 194 mg via INTRAVENOUS

## 2017-04-10 MED ORDER — CHLORHEXIDINE GLUCONATE CLOTH 2 % EX PADS: 6.0000 | MEDICATED_PAD | Freq: Once | CUTANEOUS | Status: DC

## 2017-04-10 MED ORDER — LACTATED RINGERS IV SOLN
INTRAVENOUS | Status: DC
  Administered 2017-04-10: 1000 mL via INTRAVENOUS

## 2017-04-10 MED ORDER — HEMOSTATIC AGENTS (NO CHARGE) OPTIME
TOPICAL | Status: DC | PRN
  Administered 2017-04-10: 1 via TOPICAL

## 2017-04-10 MED ORDER — MIDAZOLAM HCL 2 MG/2ML IJ SOLN
1.0000 mg | INTRAMUSCULAR | Status: AC
  Administered 2017-04-10: 2 mg via INTRAVENOUS
  Filled 2017-04-10: qty 2

## 2017-04-10 MED ORDER — FENTANYL CITRATE (PF) 100 MCG/2ML IJ SOLN
25.0000 ug | INTRAMUSCULAR | Status: DC | PRN
  Administered 2017-04-10: 50 ug via INTRAVENOUS

## 2017-04-10 MED ORDER — POVIDONE-IODINE 10 % OINT PACKET
TOPICAL_OINTMENT | CUTANEOUS | Status: DC | PRN
  Administered 2017-04-10: 1 via TOPICAL

## 2017-04-10 MED ORDER — CIPROFLOXACIN IN D5W 400 MG/200ML IV SOLN
400.0000 mg | INTRAVENOUS | Status: AC
  Administered 2017-04-10: 400 mg via INTRAVENOUS

## 2017-04-10 MED ORDER — SCOPOLAMINE 1 MG/3DAYS TD PT72
1.0000 | MEDICATED_PATCH | Freq: Once | TRANSDERMAL | Status: DC
  Administered 2017-04-10: 1.5 mg via TRANSDERMAL
  Filled 2017-04-10: qty 1

## 2017-04-10 MED ORDER — ROCURONIUM BROMIDE 100 MG/10ML IV SOLN
INTRAVENOUS | Status: DC | PRN
  Administered 2017-04-10: 30 mg via INTRAVENOUS

## 2017-04-10 SURGICAL SUPPLY — 44 items
APPLIER CLIP ROT 10 11.4 M/L (STAPLE) ×3
BAG HAMPER (MISCELLANEOUS) ×3 IMPLANT
BAG RETRIEVAL 10 (BASKET) ×1
BAG RETRIEVAL 10MM (BASKET) ×1
CHLORAPREP W/TINT 26ML (MISCELLANEOUS) ×3 IMPLANT
CLIP APPLIE ROT 10 11.4 M/L (STAPLE) ×1 IMPLANT
CLOTH BEACON ORANGE TIMEOUT ST (SAFETY) ×3 IMPLANT
COVER LIGHT HANDLE STERIS (MISCELLANEOUS) ×6 IMPLANT
DECANTER SPIKE VIAL GLASS SM (MISCELLANEOUS) ×3 IMPLANT
ELECT REM PT RETURN 9FT ADLT (ELECTROSURGICAL) ×3
ELECTRODE REM PT RTRN 9FT ADLT (ELECTROSURGICAL) ×1 IMPLANT
FILTER SMOKE EVAC LAPAROSHD (FILTER) ×3 IMPLANT
FORMALIN 10 PREFIL 120ML (MISCELLANEOUS) ×3 IMPLANT
GLOVE BIOGEL PI IND STRL 7.0 (GLOVE) ×1 IMPLANT
GLOVE BIOGEL PI INDICATOR 7.0 (GLOVE) ×2
GLOVE SURG SS PI 7.5 STRL IVOR (GLOVE) ×3 IMPLANT
GOWN STRL REUS W/ TWL XL LVL3 (GOWN DISPOSABLE) ×1 IMPLANT
GOWN STRL REUS W/TWL LRG LVL3 (GOWN DISPOSABLE) ×6 IMPLANT
GOWN STRL REUS W/TWL XL LVL3 (GOWN DISPOSABLE) ×2
HEMOSTAT SNOW SURGICEL 2X4 (HEMOSTASIS) ×3 IMPLANT
INST SET LAPROSCOPIC AP (KITS) ×3 IMPLANT
IV NS IRRIG 3000ML ARTHROMATIC (IV SOLUTION) IMPLANT
KIT ROOM TURNOVER APOR (KITS) ×3 IMPLANT
MANIFOLD NEPTUNE II (INSTRUMENTS) ×3 IMPLANT
NEEDLE INSUFFLATION 14GA 120MM (NEEDLE) ×3 IMPLANT
NS IRRIG 1000ML POUR BTL (IV SOLUTION) ×3 IMPLANT
PACK LAP CHOLE LZT030E (CUSTOM PROCEDURE TRAY) ×3 IMPLANT
PAD ARMBOARD 7.5X6 YLW CONV (MISCELLANEOUS) ×3 IMPLANT
SET BASIN LINEN APH (SET/KITS/TRAYS/PACK) ×3 IMPLANT
SET TUBE IRRIG SUCTION NO TIP (IRRIGATION / IRRIGATOR) IMPLANT
SLEEVE ENDOPATH XCEL 5M (ENDOMECHANICALS) ×3 IMPLANT
SPONGE GAUZE 2X2 8PLY STER LF (GAUZE/BANDAGES/DRESSINGS) ×1
SPONGE GAUZE 2X2 8PLY STRL LF (GAUZE/BANDAGES/DRESSINGS) ×2 IMPLANT
STAPLER VISISTAT (STAPLE) ×3 IMPLANT
SUT VICRYL 0 UR6 27IN ABS (SUTURE) ×3 IMPLANT
SYS BAG RETRIEVAL 10MM (BASKET) ×1
SYSTEM BAG RETRIEVAL 10MM (BASKET) ×1 IMPLANT
TROCAR ENDO BLADELESS 11MM (ENDOMECHANICALS) ×3 IMPLANT
TROCAR XCEL NON-BLD 5MMX100MML (ENDOMECHANICALS) ×3 IMPLANT
TROCAR XCEL UNIV SLVE 11M 100M (ENDOMECHANICALS) ×3 IMPLANT
TUBE CONNECTING 12'X1/4 (SUCTIONS) ×1
TUBE CONNECTING 12X1/4 (SUCTIONS) ×2 IMPLANT
TUBING INSUFFLATION (TUBING) ×3 IMPLANT
WARMER LAPAROSCOPE (MISCELLANEOUS) ×3 IMPLANT

## 2017-04-10 NOTE — Interval H&P Note (Signed)
History and Physical Interval Note:  04/10/2017 8:25 AM  [name]  has presented today for surgery, with the diagnosis of cholelithiasis  The various methods of treatment have been discussed with the patient and family. After consideration of risks, benefits and other options for treatment, the patient has consented to  Procedure(s): LAPAROSCOPIC CHOLECYSTECTOMY (N/A) as a surgical intervention .  The patient's history has been reviewed, patient examined, no change in status, stable for surgery.  I have reviewed the patient's chart and labs.  Questions were answered to the patient's satisfaction.     Franky MachoMark Velicia Dejager

## 2017-04-10 NOTE — Transfer of Care (Signed)
Immediate Anesthesia Transfer of Care Note  Patient: Sherry CovertJessica L Flores  Procedure(s) Performed: LAPAROSCOPIC CHOLECYSTECTOMY (N/A )  Patient Location: PACU  Anesthesia Type:General  Level of Consciousness: awake, oriented and patient cooperative  Airway & Oxygen Therapy: Patient Spontanous Breathing and Patient connected to face mask oxygen  Post-op Assessment: Report given to RN and Post -op Vital signs reviewed and stable  Post vital signs: Reviewed and stable  Last Vitals:  Vitals:   04/10/17 0840 04/10/17 0845  BP: 134/83 129/81  Resp: 10 16  Temp:    SpO2: 98% 98%    Last Pain:  Vitals:   04/10/17 0746  TempSrc: Oral      Patients Stated Pain Goal: 8 (04/10/17 0746)  Complications: No apparent anesthesia complications

## 2017-04-10 NOTE — Anesthesia Procedure Notes (Signed)
Procedure Name: Intubation Date/Time: 04/10/2017 8:56 AM Performed by: Pernell DupreADAMS, Eyonna Sandstrom A Pre-anesthesia Checklist: Timeout performed, Patient identified, Emergency Drugs available, Suction available and Patient being monitored Patient Re-evaluated:Patient Re-evaluated prior to induction Oxygen Delivery Method: Circle System Utilized Preoxygenation: Pre-oxygenation with 100% oxygen Induction Type: IV induction, Rapid sequence and Cricoid Pressure applied Ventilation: Mask ventilation without difficulty Laryngoscope Size: Miller and 3 Grade View: Grade I Tube type: Oral Tube size: 7.0 mm Number of attempts: 1 Airway Equipment and Method: Stylet Placement Confirmation: ETT inserted through vocal cords under direct vision,  positive ETCO2 and breath sounds checked- equal and bilateral Secured at: 22 cm Tube secured with: Tape Dental Injury: Teeth and Oropharynx as per pre-operative assessment

## 2017-04-10 NOTE — Discharge Instructions (Signed)

## 2017-04-10 NOTE — Anesthesia Preprocedure Evaluation (Signed)
Anesthesia Evaluation  Patient identified by MRN, date of birth, ID band Patient awake    Reviewed: Allergy & Precautions, NPO status , Patient's Chart, lab work & pertinent test results  History of Anesthesia Complications (+) PONV and history of anesthetic complications  Airway Mallampati: II  TM Distance: >3 FB Neck ROM: Full    Dental  (+) Teeth Intact   Pulmonary former smoker,    breath sounds clear to auscultation       Cardiovascular hypertension (gestational),  Rhythm:Regular Rate:Normal     Neuro/Psych negative neurological ROS  negative psych ROS   GI/Hepatic Neg liver ROS, GERD  Medicated,  Endo/Other  diabetes, Gestational  Renal/GU negative Renal ROS     Musculoskeletal   Abdominal   Peds  Hematology   Anesthesia Other Findings   Reproductive/Obstetrics                             Anesthesia Physical Anesthesia Plan  ASA: II  Anesthesia Plan: General   Post-op Pain Management:    Induction: Intravenous, Rapid sequence and Cricoid pressure planned  PONV Risk Score and Plan:   Airway Management Planned: Oral ETT  Additional Equipment:   Intra-op Plan:   Post-operative Plan: Extubation in OR  Informed Consent: I have reviewed the patients History and Physical, chart, labs and discussed the procedure including the risks, benefits and alternatives for the proposed anesthesia with the patient or authorized representative who has indicated his/her understanding and acceptance.     Plan Discussed with:   Anesthesia Plan Comments:         Anesthesia Quick Evaluation

## 2017-04-10 NOTE — Op Note (Signed)
Patient:  Sherry Flores  DOB:  [birth year]  MRN:  [id number]   Preop Diagnosis:  Biliary colic, cholelithiasis  Postop Diagnosis:  same  Procedure:  Laparoscopic cholecystectomy  Surgeon:  Franky MachoMark Sabirin Baray, M.D.  Asst.: Larae GroomsLindsey Bridges, M.D.  Anes:  General endotracheal  Indications:  Patient is a 36 year old white female who presents with biliary colic secondary to cholelithiasis. The risks and benefits of the procedure including bleeding, infection, hepatobiliary injury, and the possibility of an open procedure were fully explained to the patient, who gave informed consent.  Procedure note:  The patient was placed in the supine position. After induction of general endotracheal anesthesia, the abdomen was prepped and draped using the usual sterile technique with DuraPrep. Surgical site confirmation was performed.  A supraumbilical incision was made down to the fascia. A Veress needle was introduced into the abdominal cavity and confirmation of placement was done using the saline drop test. The abdomen was then insufflated to 16 mmHg pressure. An 11 mm trocar was introduced into the abdominal cavity under direct visualization without difficulty. The patient was placed in reverse Trendelenburg position and an additional 11 mm trocar was placed the epigastric region and 5 mm trochars were placed the right upper quadrant and right flank regions. The liver was inspected and noted to be within normal limits. The gallbladder was retracted in a dynamic fashion in order to provide a critical view of the triangle of the lower lobe. The cystic duct was first identified. Its juncture to the infundibulum was fully identified.Endoclips were placed proximally and distally on the cystic duct, and the cystic duct was divided. This was likewise done to the cystic artery. The gallbladder was freed away from the gallbladder fossa using Bovie electrocautery. The gallbladder was delivered through the epigastric  trocar site using an Endo Catch bag. The gallbladder fossa was inspected and no abnormal bleeding or bile leakage was noted. Surgicel was placed the gallbladder fossa. All fluid and air were then evacuated from the abdominal cavity prior to the removal of the trochars.  All wounds were irrigated with normal saline. All wounds were injected with 0.5% Sensorcaine. The supraumbilical fascia was reapproximated using an 0 Vicryl interrupted suture. All skin incisions were closed using staples. Betadine ointment and dry sterile dressings were applied.  All tape and needle counts were correct at the end of the procedure. The patient was extubated in the operating room and transferred to PACU in stable condition.  Complications:  none  EBL:  minimal  Specimen:  gallbladder

## 2017-04-10 NOTE — Progress Notes (Signed)
Per Mike GipAnne Hunt RN via telephone, house nursing supervisor, patients are cleared to be discharged from hospital with discharge instructions that if any eye irritation, difficulty breathing or swallowing to return to Emergency Department. Dr. Jayme CloudGonzalez verbally notified.

## 2017-04-10 NOTE — Anesthesia Postprocedure Evaluation (Signed)
Anesthesia Post Note  Patient: Sherry CovertJessica L Flores  Procedure(s) Performed: LAPAROSCOPIC CHOLECYSTECTOMY (N/A )  Patient location during evaluation: PACU Anesthesia Type: General Level of consciousness: awake and alert Pain management: satisfactory to patient Vital Signs Assessment: post-procedure vital signs reviewed and stable Respiratory status: spontaneous breathing Cardiovascular status: stable Postop Assessment: no apparent nausea or vomiting Anesthetic complications: no     Last Vitals:  Vitals:   04/10/17 0845 04/10/17 0945  BP: 129/81 (!) (P) 144/78  Pulse:  (P) 65  Resp: 16 (P) 18  Temp:  (P) 36.8 C  SpO2: 98% (P) 100%    Last Pain:  Vitals:   04/10/17 0945  TempSrc:   PainSc: (P) 7                  Yandel Zeiner

## 2017-04-11 ENCOUNTER — Encounter (HOSPITAL_COMMUNITY): Payer: Self-pay | Admitting: General Surgery

## 2017-04-18 ENCOUNTER — Encounter: Payer: Self-pay | Admitting: General Surgery

## 2017-04-18 ENCOUNTER — Ambulatory Visit (INDEPENDENT_AMBULATORY_CARE_PROVIDER_SITE_OTHER): Payer: Self-pay | Admitting: General Surgery

## 2017-04-18 VITALS — BP 132/71 | HR 60 | Temp 97.7°F | Resp 18 | Ht 64.0 in | Wt 216.0 lb

## 2017-04-18 DIAGNOSIS — Z09 Encounter for follow-up examination after completed treatment for conditions other than malignant neoplasm: Secondary | ICD-10-CM

## 2017-04-18 NOTE — Progress Notes (Signed)
Subjective:     Sherry Flores  Status post laparoscopic cholecystectomy. Doing well. No complaints. Objective:    BP 132/71   Pulse 60   Temp 97.7 F (36.5 C)   Resp 18   Ht 5\' 4"  (1.626 m)   Wt 216 lb (98 kg)   LMP 03/27/2017 (Exact Date)   BMI 37.08 kg/m   General:  alert, cooperative and no distress  Abdomen soft, incisions healing well. Staples removed, Steri-Strips applied. Final pathology consistent with diagnosis.     Assessment:    Doing well postoperatively.    Plan:   Increase activity as able. Follow-up here as needed.

## 2017-04-30 DIAGNOSIS — H52223 Regular astigmatism, bilateral: Secondary | ICD-10-CM | POA: Diagnosis not present

## 2017-04-30 DIAGNOSIS — H5203 Hypermetropia, bilateral: Secondary | ICD-10-CM | POA: Diagnosis not present

## 2017-05-08 DIAGNOSIS — Z23 Encounter for immunization: Secondary | ICD-10-CM | POA: Diagnosis not present

## 2017-05-08 DIAGNOSIS — E6609 Other obesity due to excess calories: Secondary | ICD-10-CM | POA: Diagnosis not present

## 2017-05-08 DIAGNOSIS — E8881 Metabolic syndrome: Secondary | ICD-10-CM | POA: Diagnosis not present

## 2017-05-08 DIAGNOSIS — Z6837 Body mass index (BMI) 37.0-37.9, adult: Secondary | ICD-10-CM | POA: Diagnosis not present

## 2017-05-22 DIAGNOSIS — Z6837 Body mass index (BMI) 37.0-37.9, adult: Secondary | ICD-10-CM | POA: Diagnosis not present

## 2017-05-22 DIAGNOSIS — J0101 Acute recurrent maxillary sinusitis: Secondary | ICD-10-CM | POA: Diagnosis not present

## 2017-06-10 ENCOUNTER — Encounter: Payer: Self-pay | Admitting: Women's Health

## 2017-06-10 ENCOUNTER — Other Ambulatory Visit (HOSPITAL_COMMUNITY)
Admission: RE | Admit: 2017-06-10 | Discharge: 2017-06-10 | Disposition: A | Discharge status: Home / Self Care | Payer: 59 | Source: Ambulatory Visit | Attending: Obstetrics & Gynecology | Admitting: Obstetrics & Gynecology

## 2017-06-10 ENCOUNTER — Ambulatory Visit (INDEPENDENT_AMBULATORY_CARE_PROVIDER_SITE_OTHER): Payer: 59 | Admitting: Women's Health

## 2017-06-10 VITALS — BP 120/66 | HR 60 | Ht 64.0 in | Wt 219.0 lb

## 2017-06-10 DIAGNOSIS — Z01419 Encounter for gynecological examination (general) (routine) without abnormal findings: Secondary | ICD-10-CM

## 2017-06-10 DIAGNOSIS — N898 Other specified noninflammatory disorders of vagina: Secondary | ICD-10-CM

## 2017-06-10 DIAGNOSIS — Z01411 Encounter for gynecological examination (general) (routine) with abnormal findings: Secondary | ICD-10-CM

## 2017-06-10 DIAGNOSIS — B9689 Other specified bacterial agents as the cause of diseases classified elsewhere: Secondary | ICD-10-CM

## 2017-06-10 DIAGNOSIS — N760 Acute vaginitis: Secondary | ICD-10-CM

## 2017-06-10 LAB — POCT WET PREP (WET MOUNT)
CLUE CELLS WET PREP WHIFF POC: POSITIVE
Trichomonas Wet Prep HPF POC: ABSENT

## 2017-06-10 MED ORDER — METRONIDAZOLE 500 MG PO TABS: 500.0000 mg | ORAL_TABLET | Freq: Two times a day (BID) | ORAL | 0 refills | Status: DC

## 2017-06-10 NOTE — Patient Instructions (Signed)
dermatographism

## 2017-06-10 NOTE — Progress Notes (Signed)
WELL-WOMAN EXAMINATION Patient name: Sherry Flores MRN [id number]  Date of birth: 11-Jun-1981 Chief Complaint:   Gynecologic Exam  History of Present Illness:   Sherry Flores is a 36 y.o. [id number] Caucasian female 6mth s/p RCS w/ BTL, being seen today for a routine well-woman exam. Bottlefeeding. Had A2DM during pregnancy, 2hr pp GTT was normal.  Current complaints: increased vaginal discharge x few months, worse around period. Denies odor/itching/irritation.   PCP: Jama Flavorsaniels, [facility]      does not desire labs Patient's last menstrual period was 05/12/2017 (approximate). The current method of family planning is tubal ligation Last pap 2016. Results were: normal Last mammogram: never. Results were: n/a Last colonoscopy: never. Results were: n/a  Review of Systems:   Pertinent items are noted in HPI Denies any headaches, blurred vision, fatigue, shortness of breath, chest pain, abdominal pain, abnormal vaginal discharge/itching/odor/irritation, problems with periods, bowel movements, urination, or intercourse unless otherwise stated above. Pertinent History Reviewed:  Reviewed past medical,surgical, social and family history.  Reviewed problem list, medications and allergies. Physical Assessment:   Vitals:   06/10/17 0837  BP: 120/66  Pulse: 60  Weight: 219 lb (99.3 kg)  Height: 5\' 4"  (1.626 m)  Body mass index is 37.59 kg/m.        Physical Examination:   General appearance - well appearing, and in no distress  Mental status - alert, oriented to person, place, and time  Psych:  She has a normal mood and affect  Skin - warm and dry, normal color, no suspicious lesions noted. Multiple red whelps on back from pt scratching- states that happens all the time  Chest - effort normal, all lung fields clear to auscultation bilaterally  Heart - normal rate and regular rhythm  Neck:  midline trachea, no thyromegaly or nodules  Breasts - breasts appear normal, no suspicious  masses, no skin or nipple changes or  axillary nodes  Abdomen - soft, nontender, nondistended, no masses or organomegaly  Pelvic - VULVA: normal appearing vulva with no masses, tenderness or lesions  VAGINA: normal appearing vagina with normal color and white slightly malodorous discharge, no lesions  CERVIX: normal appearing cervix without discharge or lesions, no CMT  Thin prep pap is done w/ HR HPV cotesting  UTERUS: uterus is felt to be normal size, shape, consistency and nontender   ADNEXA: No adnexal masses or tenderness noted.  Extremities:  No swelling or varicosities noted  Results for orders placed or performed in visit on 06/10/17 (from the past 24 hour(s))  POCT Wet Prep [facility] Drown([facility] [place])   Collection Time: 06/10/17  9:08 AM  Result Value Ref Range   Source Wet Prep POC vaginal    WBC, Wet Prep HPF POC none    Bacteria Wet Prep HPF POC None (A) Few   BACTERIA WET PREP MORPHOLOGY POC     Clue Cells Wet Prep HPF POC Moderate (A) None   Clue Cells Wet Prep Whiff POC Positive Whiff    Yeast Wet Prep HPF POC None    KOH Wet Prep POC     Trichomonas Wet Prep HPF POC Absent Absent    Assessment & Plan:  1) Well-Woman Exam  2) BV> Rx metronidazole 500mg  BID x 7d for BV, no sex or etoh while taking   3) Dermatographism>can try benadryl or other antihistamine  4) A2DM during recent pregnancy> normal 2hr pp GTT  Labs/procedures today: pap, wet prep  Mammogram @35yo  or sooner if problems Colonoscopy @35yo  or  sooner if problems  Orders Placed This Encounter  Procedures  . POCT Wet Prep [facility] [place])    Follow-up: Return in about 1 year (around 06/10/2018) for Physical.  Marge DuncansBooker, Peaches Vanoverbeke Randall CNM, [facility] HospitalWHNP-BC 06/10/2017 9:10 AM

## 2017-06-12 LAB — CYTOLOGY - PAP
ADEQUACY: ABSENT
DIAGNOSIS: NEGATIVE
HPV (WINDOPATH): NOT DETECTED

## 2017-07-01 ENCOUNTER — Encounter: Payer: Self-pay | Admitting: Women's Health

## 2017-07-03 ENCOUNTER — Ambulatory Visit: Payer: No Typology Code available for payment source | Admitting: Adult Health

## 2017-07-03 ENCOUNTER — Encounter: Payer: Self-pay | Admitting: Adult Health

## 2017-07-03 ENCOUNTER — Other Ambulatory Visit: Payer: Self-pay

## 2017-07-03 VITALS — BP 128/82 | HR 71 | Ht 64.0 in | Wt 218.0 lb

## 2017-07-03 DIAGNOSIS — N898 Other specified noninflammatory disorders of vagina: Secondary | ICD-10-CM | POA: Diagnosis not present

## 2017-07-03 DIAGNOSIS — R300 Dysuria: Secondary | ICD-10-CM | POA: Diagnosis not present

## 2017-07-03 DIAGNOSIS — B9689 Other specified bacterial agents as the cause of diseases classified elsewhere: Secondary | ICD-10-CM

## 2017-07-03 DIAGNOSIS — N760 Acute vaginitis: Secondary | ICD-10-CM | POA: Diagnosis not present

## 2017-07-03 LAB — POCT URINALYSIS DIPSTICK
GLUCOSE UA: NEGATIVE
Nitrite, UA: NEGATIVE
Protein, UA: NEGATIVE

## 2017-07-03 LAB — POCT WET PREP (WET MOUNT): CLUE CELLS WET PREP WHIFF POC: NEGATIVE

## 2017-07-03 MED ORDER — METRONIDAZOLE 500 MG PO TABS: 500.0000 mg | ORAL_TABLET | Freq: Two times a day (BID) | ORAL | 0 refills | Status: DC

## 2017-07-03 MED ORDER — FLUCONAZOLE 150 MG PO TABS: ORAL_TABLET | ORAL | 1 refills | Status: DC

## 2017-07-03 NOTE — Patient Instructions (Signed)
Bacterial Vaginosis Bacterial vaginosis is a vaginal infection that occurs when the normal balance of bacteria in the vagina is disrupted. It results from an overgrowth of certain bacteria. This is the most common vaginal infection among women ages 15-44. Because bacterial vaginosis increases your risk for STIs (sexually transmitted infections), getting treated can help reduce your risk for chlamydia, gonorrhea, herpes, and HIV (human immunodeficiency virus). Treatment is also important for preventing complications in pregnant women, because this condition can cause an early (premature) delivery. What are the causes? This condition is caused by an increase in harmful bacteria that are normally present in small amounts in the vagina. However, the reason that the condition develops is not fully understood. What increases the risk? The following factors may make you more likely to develop this condition:  Having a new sexual partner or multiple sexual partners.  Having unprotected sex.  Douching.  Having an intrauterine device (IUD).  Smoking.  Drug and alcohol abuse.  Taking certain antibiotic medicines.  Being pregnant.  You cannot get bacterial vaginosis from toilet seats, bedding, swimming pools, or contact with objects around you. What are the signs or symptoms? Symptoms of this condition include:  Grey or white vaginal discharge. The discharge can also be watery or foamy.  A fish-like odor with discharge, especially after sexual intercourse or during menstruation.  Itching in and around the vagina.  Burning or pain with urination.  Some women with bacterial vaginosis have no signs or symptoms. How is this diagnosed? This condition is diagnosed based on:  Your medical history.  A physical exam of the vagina.  Testing a sample of vaginal fluid under a microscope to look for a large amount of bad bacteria or abnormal cells. Your health care provider may use a cotton swab  or a small wooden spatula to collect the sample.  How is this treated? This condition is treated with antibiotics. These may be given as a pill, a vaginal cream, or a medicine that is put into the vagina (suppository). If the condition comes back after treatment, a second round of antibiotics may be needed. Follow these instructions at home: Medicines  Take over-the-counter and prescription medicines only as told by your health care provider.  Take or use your antibiotic as told by your health care provider. Do not stop taking or using the antibiotic even if you start to feel better. General instructions  If you have a female sexual partner, tell her that you have a vaginal infection. She should see her health care provider and be treated if she has symptoms. If you have a female sexual partner, he does not need treatment.  During treatment: ? Avoid sexual activity until you finish treatment. ? Do not douche. ? Avoid alcohol as directed by your health care provider. ? Avoid breastfeeding as directed by your health care provider.  Drink enough water and fluids to keep your urine clear or pale yellow.  Keep the area around your vagina and rectum clean. ? Wash the area daily with warm water. ? Wipe yourself from front to back after using the toilet.  Keep all follow-up visits as told by your health care provider. This is important. How is this prevented?  Do not douche.  Wash the outside of your vagina with warm water only.  Use protection when having sex. This includes latex condoms and dental dams.  Limit how many sexual partners you have. To help prevent bacterial vaginosis, it is best to have sex with just   one partner (monogamous).  Make sure you and your sexual partner are tested for STIs.  Wear cotton or cotton-lined underwear.  Avoid wearing tight pants and pantyhose, especially during summer.  Limit the amount of alcohol that you drink.  Do not use any products that  contain nicotine or tobacco, such as cigarettes and e-cigarettes. If you need help quitting, ask your health care provider.  Do not use illegal drugs. Where to find more information:  Centers for Disease Control and Prevention: www.cdc.gov/std  American Sexual Health Association (ASHA): www.ashastd.org  U.S. Department of Health and Human Services, Office on Women's Health: www.womenshealth.gov/ or https://www.womenshealth.gov/a-z-topics/bacterial-vaginosis Contact a health care provider if:  Your symptoms do not improve, even after treatment.  You have more discharge or pain when urinating.  You have a fever.  You have pain in your abdomen.  You have pain during sex.  You have vaginal bleeding between periods. Summary  Bacterial vaginosis is a vaginal infection that occurs when the normal balance of bacteria in the vagina is disrupted.  Because bacterial vaginosis increases your risk for STIs (sexually transmitted infections), getting treated can help reduce your risk for chlamydia, gonorrhea, herpes, and HIV (human immunodeficiency virus). Treatment is also important for preventing complications in pregnant women, because the condition can cause an early (premature) delivery.  This condition is treated with antibiotic medicines. These may be given as a pill, a vaginal cream, or a medicine that is put into the vagina (suppository). This information is not intended to replace advice given to you by your health care provider. Make sure you discuss any questions you have with your health care provider. Document Released: 06/11/2005 Document Revised: 10/15/2016 Document Reviewed: 02/25/2016 Elsevier Interactive Patient Education  2018 Elsevier Inc.  

## 2017-07-03 NOTE — Progress Notes (Signed)
Subjective:     Patient ID: Sherry Flores, female   DOB: Sep 15, 1980, 37 y.o.   MRN: [id number]  HPI Sherry BumpsJessica is a 37 year old white female in complaining of vaginal itching with burning and discharge.She used monistat 1 twice and had increased symptoms and some swelling. Has started Keto diet.    Review of Systems +vaginal itching +burning +vaginal discharge Reviewed past medical,surgical, social and family history. Reviewed medications and allergies.     Objective:   Physical Exam BP 128/82 (BP Location: Left Arm, Patient Position: Sitting, Cuff Size: Large)   Pulse 71   Ht 5\' 4"  (1.626 m)   Wt 218 lb (98.9 kg)   LMP 06/10/2017   Breastfeeding? No   BMI 37.42 kg/m  urine dipstick mod blood and leuks and small ketones. Skin warm and dry.Pelvic: external genitalia is normal in appearance no lesions, vagina: brownish discharge without odor,urethra has no lesions or masses noted, cervix:smooth and bulbous, uterus: normal size, shape and contour, non tender, no masses felt, adnexa: no masses or tenderness noted. Bladder is non tender and no masses felt. Wet prep: + for clue cells and +WBCs.     Assessment:     1. Itching in the vaginal area   2. Burning with urination   3. Vaginal discharge   4. BV (bacterial vaginosis)       Plan:     Meds ordered this encounter  Medications  . metroNIDAZOLE (FLAGYL) 500 MG tablet    Sig: Take 1 tablet (500 mg total) by mouth 2 (two) times daily.    Dispense:  14 tablet    Refill:  0    Order Specific Question:   Supervising Provider    Answer:   Despina HiddenEURE, LUTHER H [[id number]]  . fluconazole (DIFLUCAN) 150 MG tablet    Sig: Take 1 now and repeat 1 in 3 days    Dispense:  2 tablet    Refill:  1    Order Specific Question:   Supervising Provider    Answer:   Duane LopeEURE, LUTHER H [[id number]]  Review handout on BV No tub baths, or douching  F/U prn

## 2017-07-30 DIAGNOSIS — R509 Fever, unspecified: Secondary | ICD-10-CM | POA: Diagnosis not present

## 2017-07-30 DIAGNOSIS — B349 Viral infection, unspecified: Secondary | ICD-10-CM | POA: Diagnosis not present

## 2017-07-30 DIAGNOSIS — Z6835 Body mass index (BMI) 35.0-35.9, adult: Secondary | ICD-10-CM | POA: Diagnosis not present

## 2018-08-05 ENCOUNTER — Ambulatory Visit: Payer: No Typology Code available for payment source | Admitting: Adult Health

## 2018-08-06 ENCOUNTER — Ambulatory Visit: Payer: No Typology Code available for payment source | Admitting: Adult Health

## 2018-08-06 ENCOUNTER — Encounter: Payer: Self-pay | Admitting: Adult Health

## 2018-08-06 VITALS — BP 119/75 | HR 60 | Ht 64.0 in | Wt 178.0 lb

## 2018-08-06 DIAGNOSIS — R140 Abdominal distension (gaseous): Secondary | ICD-10-CM | POA: Diagnosis not present

## 2018-08-06 DIAGNOSIS — N946 Dysmenorrhea, unspecified: Secondary | ICD-10-CM | POA: Diagnosis not present

## 2018-08-06 DIAGNOSIS — R102 Pelvic and perineal pain: Secondary | ICD-10-CM

## 2018-08-06 DIAGNOSIS — N941 Unspecified dyspareunia: Secondary | ICD-10-CM

## 2018-08-06 NOTE — Patient Instructions (Signed)
Dyspareunia, Female  Dyspareunia is pain that is associated with sexual activity. This can affect any part of the genitals or lower abdomen, and there are many possible causes. This condition ranges from mild to severe. Depending on the cause, dyspareunia may get better with treatment, or it may return (recur) over time. What are the causes? The cause of this condition is not always known. Possible causes include:  Cancer.  Psychological factors, such as depression, anxiety, or previous traumatic experiences.  Severe pain and tenderness of the skin around the vagina (vulva) when it is touched (vulvar vestibulitis syndrome).  Infection of the pelvis or the vulva.  Infection of the vagina.  Painful, involuntary tightening (contraction) of the vaginal muscles when anything is put inside the vagina (vaginismus).  Allergic reaction.  Ovarian cysts.  Solid growths of tissue (tumors) in the ovaries or the uterus.  Scar tissue in the ovaries, vagina, or pelvis.  Vaginal dryness.  Thinning of the tissue (atrophy) of the vulva and vagina.  Skin conditions that affect the vulva (vulvar dermatoses), such as lichen sclerosus or lichen planus.  Endometriosis.  Tubal pregnancy.  A tilted uterus.  Uterine prolapse.  Adhesions in the vagina.  Bladder problems.  Intestinal problems.  Certain medicines.  Medical conditions such as diabetes, arthritis, or thyroid disease. What increases the risk? The following factors may make you more likely to develop this condition:  Having experienced physical or sexual trauma.  Having given birth more than once.  Taking birth control pills.  Having gone through menopause.  Having recently given birth, typically within the past 3-6 months.  Breastfeeding. What are the signs or symptoms? The main symptom of this condition is pain in any part of the genitals or lower abdomen during or after sexual activity. This may include pain during  sexual arousal, genital stimulation, or orgasm. Pain may get worse when anything is inserted into the vagina, or when the genitals are touched in any way, such as when sitting or wearing pants. Pain can range from mild to severe, depending on the cause of the condition. In some cases, symptoms go away with treatment and return (recur) at a later date. How is this diagnosed? This condition may be diagnosed based on:  Your symptoms, including: ? Where your pain is located. ? When your pain occurs.  Your medical history.  A physical exam. This may include a pelvic exam and a Pap test. This is a screening test that is used to check for signs of cancer of the vagina, cervix, and uterus.  Tests, including: ? Blood tests. ? Ultrasound. This uses sound waves to make a picture of the area that is being tested. ? Urine culture. This test involves checking a urine sample for signs of infection. ? Culture test. This is when your health care provider uses a swab to collect a sample of vaginal fluid. The sample is checked for signs of infection. ? X-rays. ? MRI. ? CT scan. ? Laparoscopy. This is a procedure in which a small incision is made in your lower abdomen and a lighted, pencil-sized instrument (laparoscope) is passed through the incision and used to look inside your pelvis. You may be referred to a health care provider who specializes in women's health (gynecologist). In some cases, diagnosing the cause of dyspareunia can be difficult. How is this treated? Treatment depends on the cause of your condition and your symptoms. In most cases, you may need to stop sexual activity until your symptoms improve. Treatment  may include:  Lubricants.  Kegel exercises or vaginal dilators.  Medicated skin creams.  Medicated vaginal creams.  Hormonal therapy.  Antibiotic medicine to prevent or fight infection.  Medicines that help to relieve pain.  Medicines that treat depression  (antidepressants).  Psychological counseling.  Sex therapy.  Surgery. Follow these instructions at home: Lifestyle  Avoid tight clothing and irritating materials around your genital and abdominal area.  Use water-based lubricants as needed. Avoid oil-based lubricants.  Do not use any products that irritate you. This may include certain condoms, spermicides, lubricants, soaps, tampons, vaginal sprays, or douches.  Always practice safe sex. Talk with your health care provider about which form of birth control (contraception) is best for you.  Maintain open communication with your sexual partner. General instructions  Take over-the-counter and prescription medicines only as told by your health care provider.  If you had tests done, it is your responsibility to get your tests results. Ask your health care provider or the department performing the test when your results will be ready.  Urinate before you engage in sexual activity.  Consider joining a support group.  Keep all follow-up visits as told by your health care provider. This is important. Contact a health care provider if:  You develop vaginal bleeding after sexual intercourse.  You develop a lump at the opening of your vagina. Seek medical care even if the lump is painless.  You have: ? Abnormal vaginal discharge. ? Vaginal dryness. ? Itchiness or irritation of your vulva or vagina. ? A new rash. ? Symptoms that get worse or do not improve with treatment. ? A fever. ? Pain when you urinate. ? Blood in your urine. Get help right away if:  You develop severe pain in your abdomen during or shortly after sexual intercourse.  You pass out after having sexual intercourse. This information is not intended to replace advice given to you by your health care provider. Make sure you discuss any questions you have with your health care provider. Document Released: 07/01/2007 Document Revised: 10/21/2015 Document Reviewed:  01/11/2015 Elsevier Interactive Patient Education  2019 Elsevier Inc. Dysmenorrhea  Dysmenorrhea refers to cramps caused by the muscles of the uterus tightening (contracting) during a menstrual period. Dysmenorrhea may be mild, or it may be severe enough to interfere with everyday activities for a few days each month. Primary dysmenorrhea is menstrual cramps that last a couple of days when you start having menstrual periods or soon after. This often begins after a teenager starts having her period. As a woman gets older or has a baby, the cramps will usually lessen or disappear. Secondary dysmenorrhea begins later in life and is caused by a disorder in the reproductive system. It lasts longer, and it may cause more pain than primary dysmenorrhea. The pain may start before the period and last a few days after the period. What are the causes? Dysmenorrhea is usually caused by an underlying problem, such as:  The tissue that lines the uterus (endometrium) growing outside of the uterus in other areas of the body (endometriosis).  Endometrial tissue growing into the muscular walls of the uterus (adenomyosis).  Blood vessels in the pelvis becoming filled with blood just before the menstrual period (pelvic congestive syndrome).  Overgrowth of cells (polyps) in the endometrium or the lower part of the uterus (cervix).  The uterus dropping down into the vagina (prolapse) due to stretched or weak muscles.  Bladder problems, such as infection or inflammation.  Intestinal problems, such as  a tumor or irritable bowel syndrome.  Cancer of the reproductive organs or bladder.  A severely tipped uterus.  A cervix that is closed or has a very small opening.  Noncancerous (benign) tumors of the uterus (fibroids).  Pelvic inflammatory disease (PID).  Pelvic scarring (adhesions) from a previous surgery.  An ovarian cyst.  An IUD (intrauterine device). What increases the risk? You are more likely  to develop this condition if:  You are younger than age 52.  You started puberty early.  You have irregular or heavy bleeding.  You have never given birth.  You have a family history of dysmenorrhea.  You smoke. What are the signs or symptoms? Symptoms of this condition include:  Cramping, throbbing pain, or a feeling of fullness in the lower abdomen.  Lower back pain.  Periods lasting for longer than 7 days.  Headaches.  Bloating.  Fatigue.  Nausea or vomiting.  Diarrhea.  Sweating or dizziness.  Loose stools. How is this diagnosed? This condition may be diagnosed based on:  Your symptoms.  Your medical history.  A physical exam.  Blood tests.  A Pap test. This is a test in which cells from the cervix are tested for signs of cancer or infection.  A pregnancy test.  Imaging tests, such as: ? Ultrasound. ? A procedure to remove and examine a sample of endometrial tissue (dilation and curettage, D&C). ? A procedure to visually examine the inside of:  The uterus (hysteroscopy).  The abdomen or pelvis (laparoscopy).  The bladder (cystoscopy).  The intestine (colonoscopy).  The stomach (gastroscopy). ? X-rays. ? CT scan. ? MRI. How is this treated? Treatment depends on the cause of the dysmenorrhea. Treatment may include:  Pain medicine prescribed by your health care provider.  Birth control pills that contain the hormone progesterone.  An IUD that contains the hormone progesterone.  Medicines to control bleeding.  Hormone replacement therapy.  NSAIDs. These may help to stop the production of hormones that cause cramps.  Antidepressant medicines.  Surgery to remove adhesions, endometriosis, ovarian cysts, fibroids, or the entire uterus (hysterectomy).  Injections of progesterone to stop the menstrual period.  A procedure to destroy the endometrium (endometrial ablation).  A procedure to cut the nerves in the bottom of the spine  (sacrum) that go to the reproductive organs (presacral neurectomy).  A procedure to apply an electric current to nerves in the sacrum (sacral nerve stimulation).  Exercise and physical therapy.  Meditation and yoga therapy.  Acupuncture. Work with your health care provider to determine what treatment or combination of treatments is best for you. Follow these instructions at home: Relieving pain and cramping  Apply heat to your lower back or abdomen when you experience pain or cramps. Use the heat source that your health care provider recommends, such as a moist heat pack or a heating pad. ? Place a towel between your skin and the heat source. ? Leave the heat on for 20-30 minutes. ? Remove the heat if your skin turns bright red. This is especially important if you are unable to feel pain, heat, or cold. You may have a greater risk of getting burned. ? Do not sleep with a heating pad on.  Do aerobic exercises, such as walking, swimming, or biking. This can help to relieve cramps.  Massage your lower back or abdomen to help relieve pain. General instructions  Take over-the-counter and prescription medicines only as told by your health care provider.  Do not drive or use  heavy machinery while taking prescription pain medicine.  Avoid alcohol and caffeine during and right before your menstrual period. These can make cramps worse.  Do not use any products that contain nicotine or tobacco, such as cigarettes and e-cigarettes. If you need help quitting, ask your health care provider.  Keep all follow-up visits as told by your health care provider. This is important. Contact a health care provider if:  You have pain that gets worse or does not get better with medicine.  You have pain with sex.  You develop nausea or vomiting with your period that is not controlled with medicine. Get help right away if:  You faint. Summary  Dysmenorrhea refers to cramps caused by the muscles of  the uterus tightening (contracting) during a menstrual period.  Dysmenorrhea may be mild, or it may be severe enough to interfere with everyday activities for a few days each month.  Treatment depends on the cause of the dysmenorrhea.  Work with your health care provider to determine what treatment or combination of treatments is best for you. This information is not intended to replace advice given to you by your health care provider. Make sure you discuss any questions you have with your health care provider. Document Released: 06/11/2005 Document Revised: 07/14/2016 Document Reviewed: 07/14/2016 Elsevier Interactive Patient Education  2019 Elsevier Inc. Pelvic Pain, Female Pelvic pain is pain in your lower abdomen, below your belly button and between your hips. The pain may start suddenly (be acute), keep coming back (be recurring), or last a long time (become chronic). Pelvic pain that lasts longer than 6 months is considered chronic. Pelvic pain may affect your:  Reproductive organs.  Urinary system.  Digestive tract.  Musculoskeletal system. There are many potential causes of pelvic pain. Sometimes, the pain can be a result of digestive or urinary conditions, strained muscles or ligaments, or reproductive conditions. Sometimes the cause of pelvic pain is not known. Follow these instructions at home:   Take over-the-counter and prescription medicines only as told by your health care provider.  Rest as told by your health care provider.  Do not have sex if it hurts.  Keep a journal of your pelvic pain. Write down: ? When the pain started. ? Where the pain is located. ? What seems to make the pain better or worse, such as food or your period (menstrual cycle). ? Any symptoms you have along with the pain.  Keep all follow-up visits as told by your health care provider. This is important. Contact a health care provider if:  Medicine does not help your pain.  Your pain  comes back.  You have new symptoms.  You have abnormal vaginal discharge or bleeding, including bleeding after menopause.  You have a fever or chills.  You are constipated.  You have blood in your urine or stool.  You have foul-smelling urine.  You feel weak or light-headed. Get help right away if:  You have sudden severe pain.  Your pain gets steadily worse.  You have severe pain along with fever, nausea, vomiting, or excessive sweating.  You lose consciousness. Summary  Pelvic pain is pain in your lower abdomen, below your belly button and between your hips.  There are many potential causes of pelvic pain.  Keep a journal of your pelvic pain. This information is not intended to replace advice given to you by your health care provider. Make sure you discuss any questions you have with your health care provider. Document Released: 05/08/2004  Document Revised: 11/27/2017 Document Reviewed: 11/27/2017 Elsevier Interactive Patient Education  [employer]2019 Else[employer] Inc.

## 2018-08-06 NOTE — Progress Notes (Signed)
Patient ID: Sherry Flores, female   DOB: 12-17-1980, 38 y.o.   MRN: [id number] History of Present Illness: Sherry Flores is a 38 year old white female, married, G3P3 in complaining of pain with sex, bloating and painful periods.She has lost 40-50 lbs over last year doing Keto diet and husband has lost about 80. She had 3  C-sections  and is sp tubal.  PCP is Dr Reuel Boom.   Current Medications, Allergies, Past Medical History, Past Surgical History, Family History and Social History were reviewed in [employer] record.     Review of Systems: Pain with sex Periods painful Feels bloated at times, with pressure Periods regular, maybe heavy first 2 days     Physical Exam:BP 119/75 (BP Location: Left Arm, Patient Position: Sitting, Cuff Size: Normal)   Pulse 60   Ht 5\' 4"  (1.626 m)   Wt 178 lb (80.7 kg)   LMP 07/23/2018 (Exact Date)   BMI 30.55 kg/m  General:  Well developed, well nourished, no acute distress Skin:  Warm and dry Pelvic:  External genitalia is normal in appearance, no lesions.  The vagina is normal in appearance. Urethra has no lesions or masses. The cervix is smooth, no CMT.  Uterus is felt to be normal size, shape, and contour. +mildly tender. No adnexal masses, +LLQ tenderness noted.Bladder is non tender, no masses felt. Psych:  No mood changes, alert and cooperative,seems happy Fall risk is low. PHQ 2 score 0. Examination chaperoned by Marchelle Folks Rash LPN.  Will get [place] to assess and talk when results back   Impression:  1. Pelvic pain   2. Dyspareunia in female   3. Bloating   4. Dysmenorrhea      Plan: Return in 3 week for GYN [place] Will talk when results back Review handouts on pelvic pain, dysmenorrhea and dyspareunia

## 2018-08-27 ENCOUNTER — Ambulatory Visit (INDEPENDENT_AMBULATORY_CARE_PROVIDER_SITE_OTHER): Payer: No Typology Code available for payment source

## 2018-08-27 DIAGNOSIS — R102 Pelvic and perineal pain: Secondary | ICD-10-CM

## 2018-08-27 DIAGNOSIS — R140 Abdominal distension (gaseous): Secondary | ICD-10-CM | POA: Diagnosis not present

## 2018-08-27 DIAGNOSIS — N941 Unspecified dyspareunia: Secondary | ICD-10-CM

## 2018-08-27 NOTE — Progress Notes (Signed)
PELVIC US TA/TV:heterogeneous anteverted uterus,mult simple nabothian cysts,EEC 5.6 mm,normal ovaries bilat,ovaries appear mobile,no pain during ultrasound,no free fluid

## 2018-08-29 ENCOUNTER — Other Ambulatory Visit: Payer: Self-pay | Admitting: Adult Health

## 2018-08-29 MED ORDER — ELAGOLIX SODIUM 150 MG PO TABS: 150.0000 mg | ORAL_TABLET | Freq: Every day | ORAL | 0 refills | Status: DC

## 2018-08-29 NOTE — Progress Notes (Signed)
rx Dewayne Hatch

## 2018-09-23 ENCOUNTER — Other Ambulatory Visit: Payer: Self-pay | Admitting: Women's Health

## 2018-09-23 MED ORDER — ELAGOLIX SODIUM 150 MG PO TABS: 150.0000 mg | ORAL_TABLET | Freq: Every day | ORAL | 3 refills | Status: DC

## 2018-09-30 ENCOUNTER — Telehealth: Payer: Self-pay | Admitting: Adult Health

## 2018-09-30 MED ORDER — ELAGOLIX SODIUM 150 MG PO TABS: 150.0000 mg | ORAL_TABLET | Freq: Every day | ORAL | 3 refills | Status: DC

## 2018-09-30 NOTE — Telephone Encounter (Signed)
Refilled orilissa to [facility]

## 2018-10-02 ENCOUNTER — Telehealth: Payer: Self-pay | Admitting: *Deleted

## 2018-10-02 NOTE — Telephone Encounter (Signed)
Pharmacy notified Dewayne Hatch approved by insurance.

## 2018-10-10 ENCOUNTER — Other Ambulatory Visit: Payer: Self-pay | Admitting: Women's Health

## 2018-11-20 ENCOUNTER — Telehealth: Payer: Self-pay | Admitting: Adult Health

## 2018-11-20 ENCOUNTER — Other Ambulatory Visit: Payer: Self-pay | Admitting: Adult Health

## 2018-11-20 MED ORDER — NORETHIN-ETH ESTRAD-FE BIPHAS 1 MG-10 MCG / 10 MCG PO TABS: 1.0000 | ORAL_TABLET | Freq: Every day | ORAL | 11 refills | Status: DC

## 2018-11-20 MED ORDER — NORETHIN ACE-ETH ESTRAD-FE 1-20 MG-MCG PO TABS: 1.0000 | ORAL_TABLET | Freq: Every day | ORAL | 11 refills | Status: DC

## 2018-11-20 NOTE — Telephone Encounter (Signed)
[facility] in [place] states the pts insurance wants her to start to start on something different than the Norethindrone-Ethinyl Estradiol-Fe Biphas (LO LOESTRIN FE) 1 MG-10 MCG / 10 MCG tablet before they will cover the medicaiton.  CB#: [phone]

## 2018-11-20 NOTE — Telephone Encounter (Signed)
Spoke with [employer] at [employer]. Pt's insurance will cover Junel FE 1/20. Is this ok? Please advise. Thanks!! JSY

## 2018-11-20 NOTE — Telephone Encounter (Signed)
Will rx junel 1-20 °

## 2018-11-20 NOTE — Progress Notes (Signed)
Will stop orilissa and try lo loestrin

## 2019-01-28 ENCOUNTER — Other Ambulatory Visit: Payer: Self-pay | Admitting: Adult Health

## 2019-02-18 NOTE — Telephone Encounter (Signed)
Please see below.

## 2019-02-19 ENCOUNTER — Telehealth: Payer: Self-pay | Admitting: Obstetrics & Gynecology

## 2019-02-19 MED ORDER — TERCONAZOLE 0.4 % VA CREA: 1.0000 | TOPICAL_CREAM | Freq: Every day | VAGINAL | 1 refills | Status: DC

## 2019-04-26 IMAGING — US US ABDOMEN COMPLETE
1 series · 13 of 25 positions shown · non-contrast
Comparison: CT scan of March 29, 2012.

CLINICAL DATA: Right upper quadrant abdominal pain.

EXAM:
ABDOMEN ULTRASOUND COMPLETE

[Series 1: us abdomen complete · 0.18mm/px · 13 of 114 slices shown]
[im 1/114]
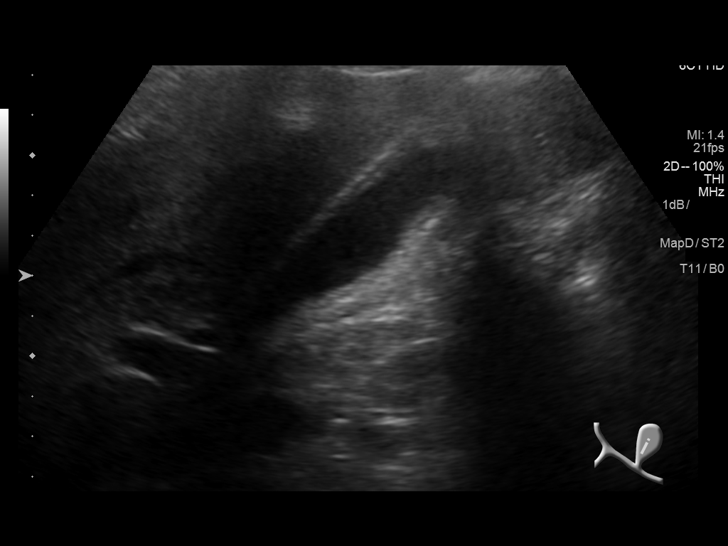
[im 10/114]
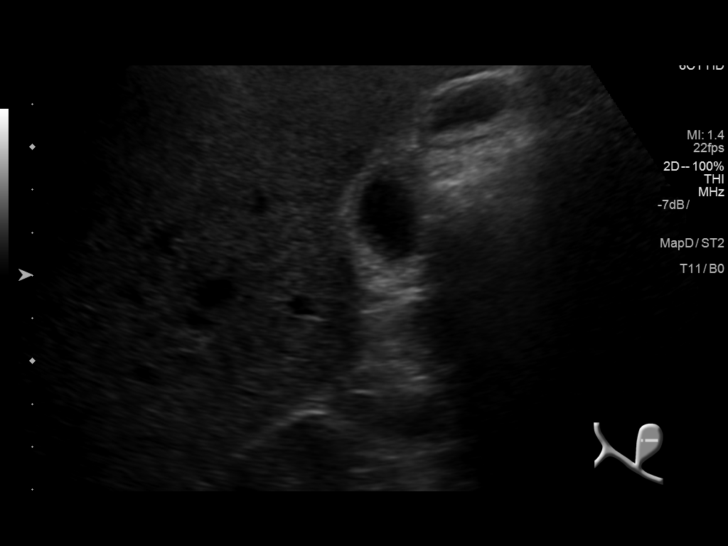
[im 19/114]
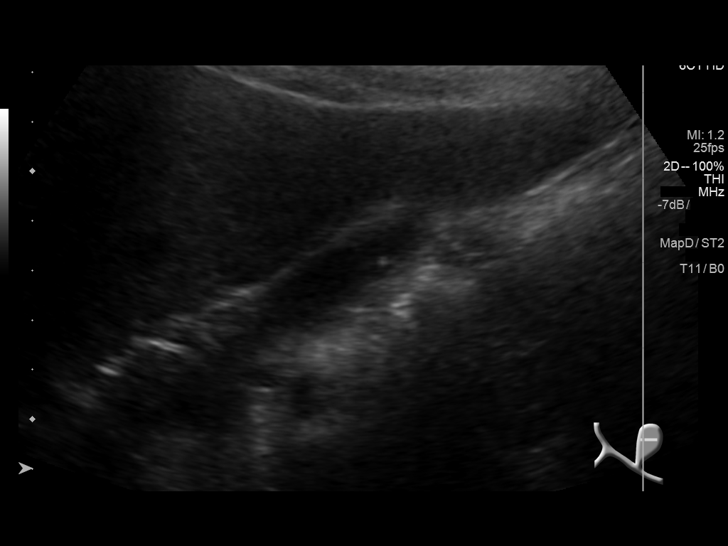
[im 29/114]
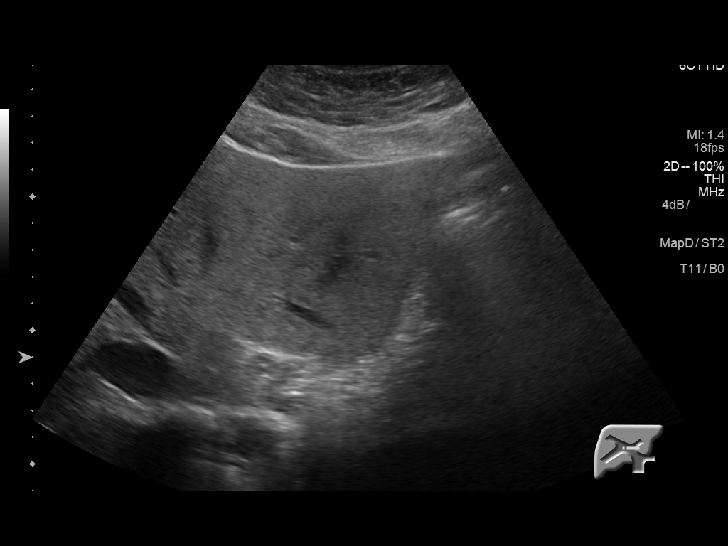
[im 38/114]
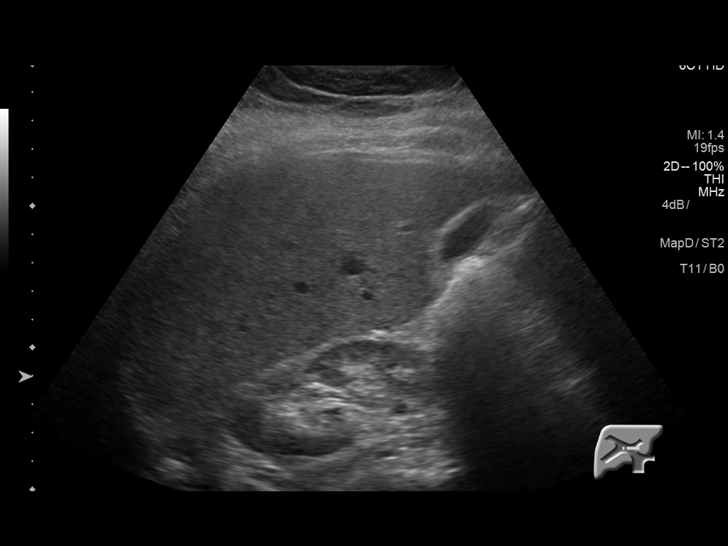
[im 48/114]
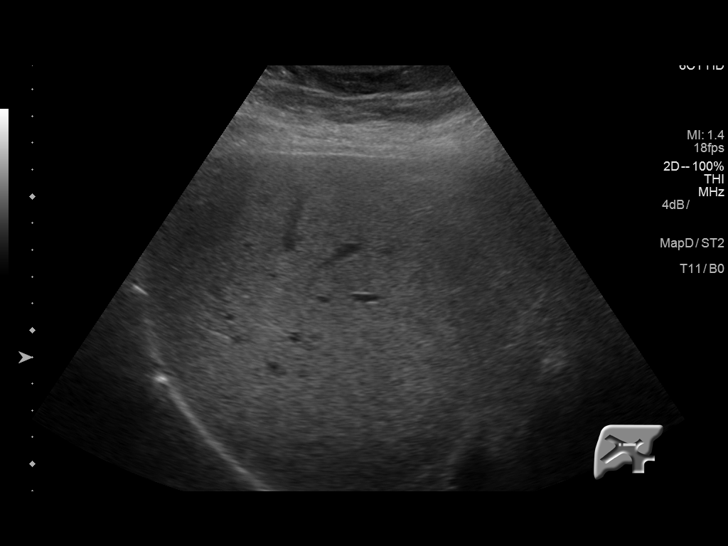
[im 57/114]
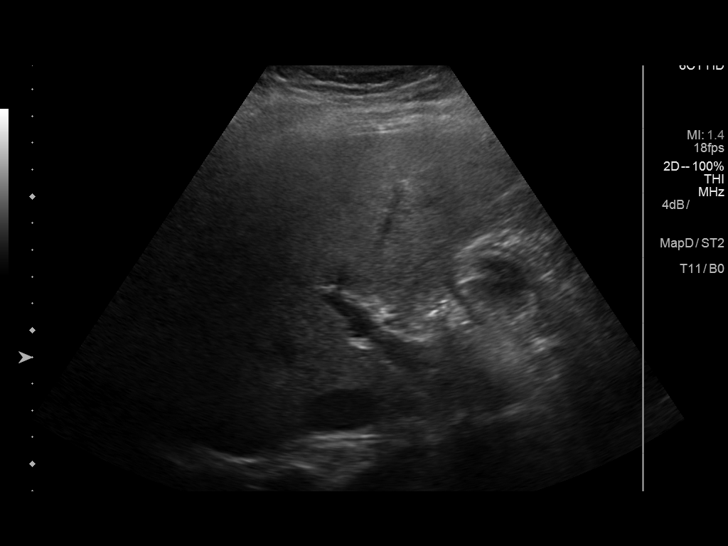
[im 66/114]
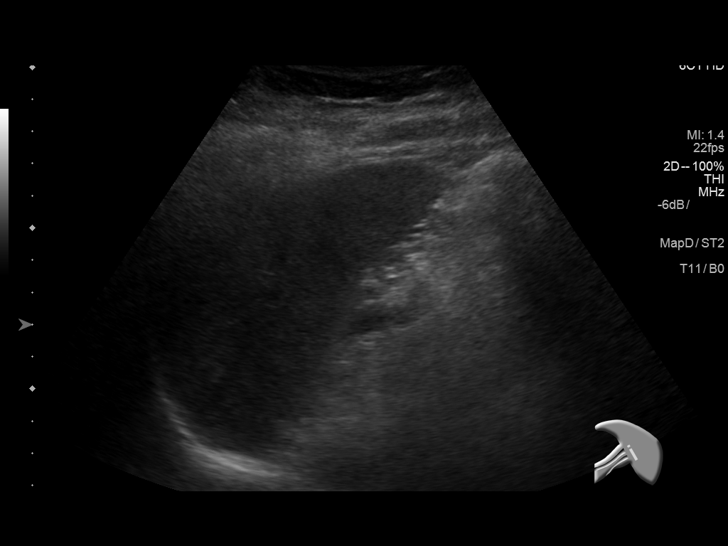
[im 76/114]
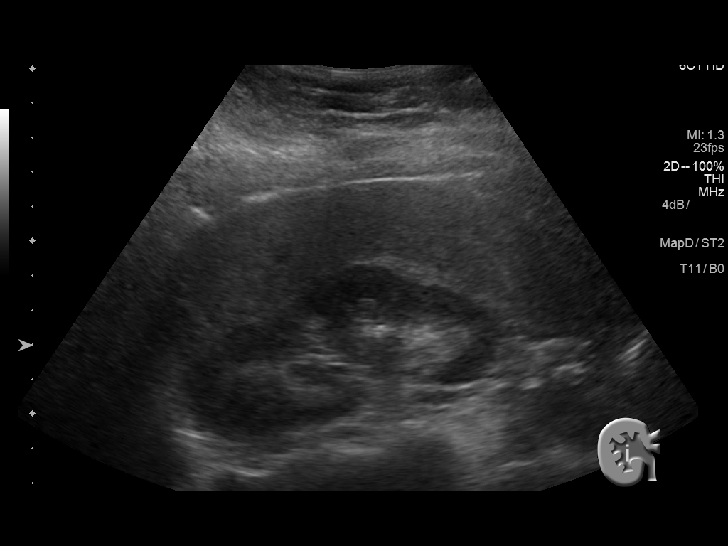
[im 85/114]
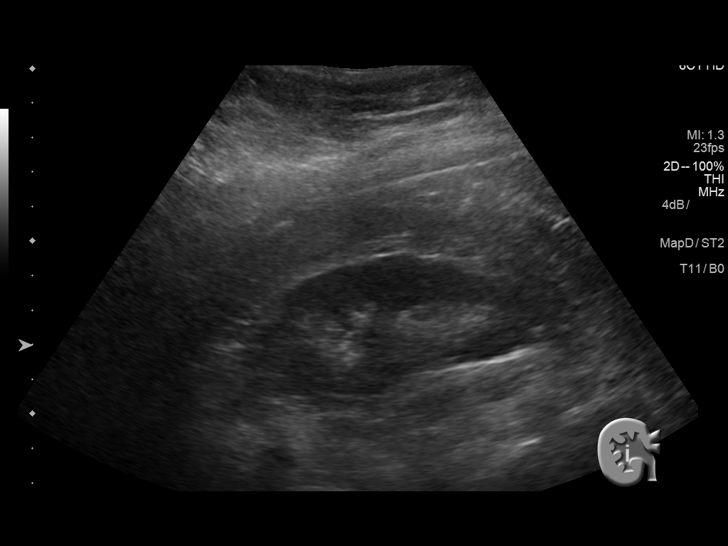
[im 95/114]
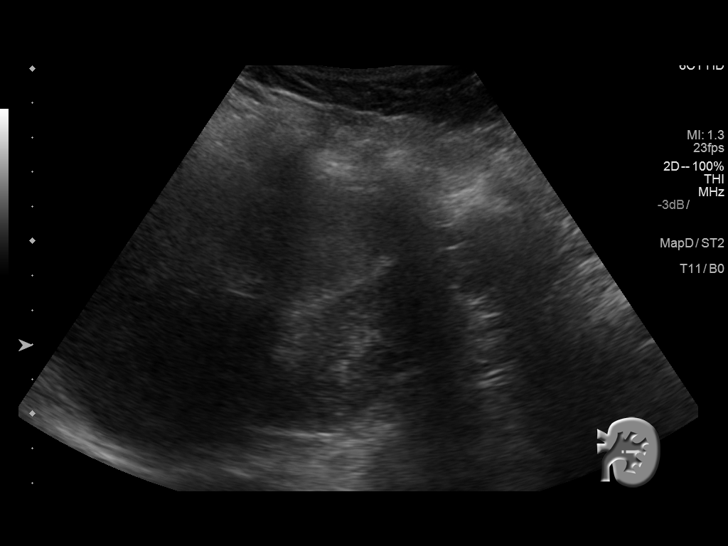
[im 104/114]
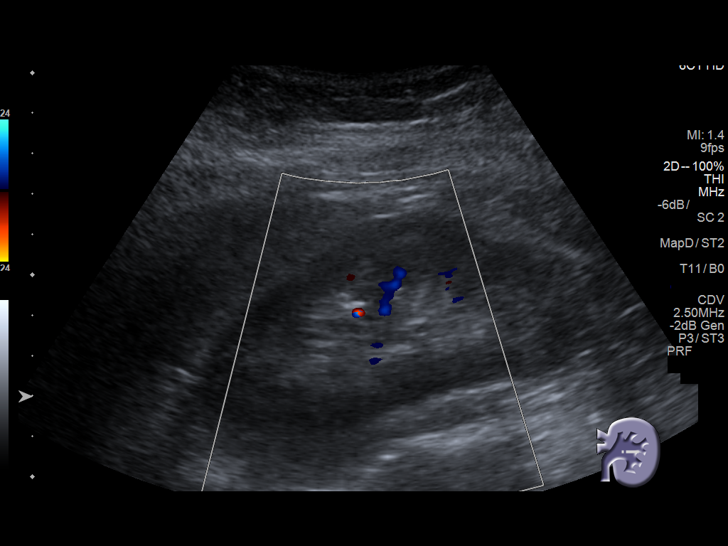
[im 114/114]
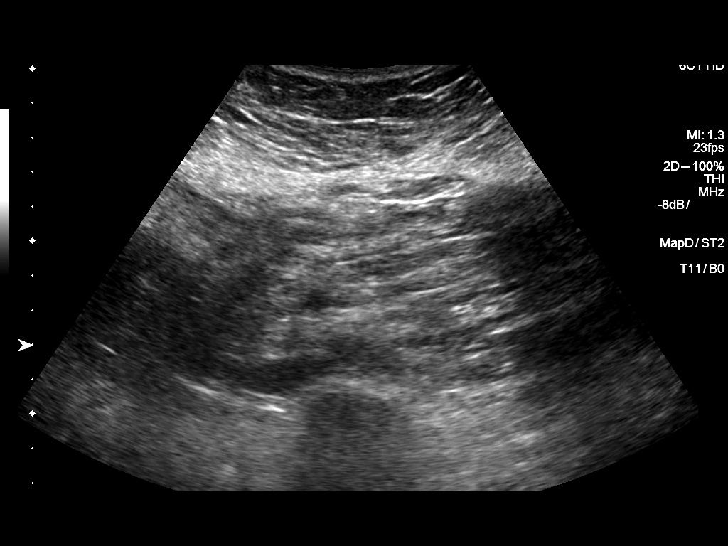

[13 of 25 positions shown; findings below may reference images not displayed]

FINDINGS: Gallbladder: 4 mm calculus is noted. Mild gallbladder wall
thickening is noted without pericholecystic fluid or sonographic
Murphy's sign.

Common bile duct: Diameter: 3.8 mm which is within normal limits.

Liver: No focal lesion identified. Increased echogenicity of hepatic
parenchyma is noted suggesting fatty infiltration. Portal vein is
patent on color Doppler imaging with normal direction of blood flow
towards the liver.

IVC: No abnormality visualized.

Pancreas: Visualized portion unremarkable.

Spleen: Size and appearance within normal limits.

Right Kidney: Length: 9.7 cm. 4 mm nonobstructive calculus is noted
in upper pole. Echogenicity within normal limits. No mass or
hydronephrosis visualized.

Left Kidney: Length: 10.1 cm. 1.3 cm cyst is noted. 5 mm
nonobstructive calculus is noted in midpole. Echogenicity within
normal limits. No mass or hydronephrosis visualized.

Abdominal aorta: No aneurysm visualized.

Other findings: None.
IMPRESSION: Small gallstone is noted with mild gallbladder wall thickening, but
no pericholecystic fluid or sonographic Murphy's sign is noted. If
there is clinical concern for cholecystitis, HIDA scan is
recommended for further evaluation.

Increased echogenicity of hepatic parenchyma is noted suggesting
fatty infiltration.

Bilateral nonobstructive nephrolithiasis. No hydronephrosis is
noted.

## 2019-07-01 ENCOUNTER — Other Ambulatory Visit: Payer: Self-pay | Admitting: Obstetrics & Gynecology

## 2019-09-23 ENCOUNTER — Encounter: Payer: Self-pay | Admitting: *Deleted

## 2019-11-21 ENCOUNTER — Other Ambulatory Visit: Payer: Self-pay | Admitting: Adult Health

## 2020-06-03 ENCOUNTER — Encounter: Payer: Self-pay | Admitting: Adult Health

## 2020-06-03 ENCOUNTER — Other Ambulatory Visit (HOSPITAL_COMMUNITY)
Admission: RE | Admit: 2020-06-03 | Discharge: 2020-06-03 | Disposition: A | Discharge status: Home / Self Care | Payer: No Typology Code available for payment source | Source: Ambulatory Visit | Attending: Adult Health | Admitting: Adult Health

## 2020-06-03 ENCOUNTER — Other Ambulatory Visit: Payer: Self-pay

## 2020-06-03 ENCOUNTER — Ambulatory Visit (INDEPENDENT_AMBULATORY_CARE_PROVIDER_SITE_OTHER): Payer: No Typology Code available for payment source | Admitting: Adult Health

## 2020-06-03 VITALS — BP 111/72 | HR 57 | Ht 64.0 in | Wt 204.0 lb

## 2020-06-03 DIAGNOSIS — Z01419 Encounter for gynecological examination (general) (routine) without abnormal findings: Secondary | ICD-10-CM | POA: Insufficient documentation

## 2020-06-03 NOTE — Progress Notes (Signed)
Patient ID: Sherry Flores, female   DOB: Sep 13, 1980, 39 y.o.   MRN: [id number] History of Present Illness:  Sherry Flores is a 39 year old white female,married, G3P3 in for a well woman gyn exam and pap. She has had GI issue with some blood and has appointment with GI in February she says. PCP is DR Reuel Boom.  Current Medications, Allergies, Past Medical History, Past Surgical History, Family History and Social History were reviewed in [employer] record.     Review of Systems: Patient denies any hearing loss, fatigue, blurred vision, shortness of breath, chest pain, abdominal pain, problems with bowel movements, urination, or intercourse. No joint pain or mood swings. Has recently started Topamax for headaches.    Physical Exam:BP 111/72 (BP Location: Left Arm, Patient Position: Sitting, Cuff Size: Normal)   Pulse (!) 57   Ht 5\' 4"  (1.626 m)   Wt 204 lb (92.5 kg)   LMP 05/21/2020   BMI 35.02 kg/m  General:  Well developed, well nourished, no acute distress Skin:  Warm and dry Neck:  Midline trachea, normal thyroid, good ROM, no lymphadenopathy Lungs; Clear to auscultation bilaterally Breast:  No dominant palpable mass, retraction, or nipple discharge Cardiovascular: Regular rate and rhythm Abdomen:  Soft, non tender, no hepatosplenomegaly Pelvic:  External genitalia is normal in appearance, no lesions.  The vagina is normal in appearance. Urethra has no lesions or masses. The cervix is smooth, pap with high risk HPV genotyping performed. Uterus is felt to be normal size, shape, and contour.  No adnexal masses or tenderness noted.Bladder is non tender, no masses felt. Extremities/musculoskeletal:  No swelling or varicosities noted, no clubbing or cyanosis Psych:  No mood changes, alert and cooperative,seems happy AA is 3 Fall risk is low PHQ 9 score is 4,denies depression   Upstream - 06/03/20 0940      Pregnancy Intention Screening   Does the patient want to  become pregnant in the next year? No    Does the patient's partner want to become pregnant in the next year? No    Would the patient like to discuss contraceptive options today? No      Contraception Wrap Up   Current Method Female Sterilization    End Method Female Sterilization    Contraception Counseling Provided No         Examination chaperoned by 14/10/21 LPN  Impression and Plan: 1. Encounter for gynecological examination with Papanicolaou smear of cervix Pap sent Physical in 1 year  Pap in 3 years if normal Labs with PCP,just had and were normal she says Mammogram at 39

## 2020-06-06 LAB — CYTOLOGY - PAP
Adequacy: ABSENT
Comment: NEGATIVE
Diagnosis: NEGATIVE
High risk HPV: NEGATIVE

## 2020-08-01 ENCOUNTER — Encounter (INDEPENDENT_AMBULATORY_CARE_PROVIDER_SITE_OTHER): Payer: Self-pay | Admitting: Gastroenterology

## 2020-08-01 ENCOUNTER — Other Ambulatory Visit (INDEPENDENT_AMBULATORY_CARE_PROVIDER_SITE_OTHER): Payer: Self-pay

## 2020-08-01 ENCOUNTER — Encounter (INDEPENDENT_AMBULATORY_CARE_PROVIDER_SITE_OTHER): Payer: Self-pay

## 2020-08-01 ENCOUNTER — Ambulatory Visit (INDEPENDENT_AMBULATORY_CARE_PROVIDER_SITE_OTHER): Payer: No Typology Code available for payment source | Admitting: Gastroenterology

## 2020-08-01 ENCOUNTER — Other Ambulatory Visit: Payer: Self-pay

## 2020-08-01 ENCOUNTER — Telehealth (INDEPENDENT_AMBULATORY_CARE_PROVIDER_SITE_OTHER): Payer: Self-pay

## 2020-08-01 DIAGNOSIS — Z1211 Encounter for screening for malignant neoplasm of colon: Secondary | ICD-10-CM

## 2020-08-01 DIAGNOSIS — K580 Irritable bowel syndrome with diarrhea: Secondary | ICD-10-CM | POA: Diagnosis not present

## 2020-08-01 DIAGNOSIS — K625 Hemorrhage of anus and rectum: Secondary | ICD-10-CM

## 2020-08-01 DIAGNOSIS — K219 Gastro-esophageal reflux disease without esophagitis: Secondary | ICD-10-CM | POA: Diagnosis not present

## 2020-08-01 DIAGNOSIS — K589 Irritable bowel syndrome without diarrhea: Secondary | ICD-10-CM | POA: Insufficient documentation

## 2020-08-01 MED ORDER — DICYCLOMINE HCL 10 MG PO CAPS: 10.0000 mg | ORAL_CAPSULE | Freq: Two times a day (BID) | ORAL | 1 refills | Status: AC | PRN

## 2020-08-01 MED ORDER — NA SULFATE-K SULFATE-MG SULF 17.5-3.13-1.6 GM/177ML PO SOLN: 354.0000 mL | Freq: Once | ORAL | 0 refills | Status: AC

## 2020-08-01 NOTE — H&P (View-Only) (Signed)
Sherry Flores, M.D. Gastroenterology & Hepatology [facility] [address] [place], [place] [address] Primary Care Physician: Sherry Chimera, MD [address] [place] [address]  Referring MD: Dr. Richardean Flores  Chief Complaint:  Abdominal pain, rectal bleeding  History of Present Illness: Sherry Flores is a 40 y.o. female with past medical history of GERD, urolithiasis, recurrent UTIs, who presents for evaluation of abdominal pain and rectal bleeding.  Patient reports that for the last 3 months she has presented intermittent episodes of abdominal pain in the mid abdomen that did not radiate anywhere else, described as a pressure. Pain was intermittent, lasting for a couple of hours. She believes having a BM helped her ease the pain. Her last episode was a couple of weeks, now is less frequent than before. She also noted having very watery BMs, possibly once or twice a week, specially when she gets stressed. She has been losing weight on purpose by changing her diet and exercising.  Patient has a chronic history of GERD, has an episode of heartburn once a month after changing her lifestyle habits. Takes milk for breakthrough symptoms but is not taking any medication.  Patient states she has had intermittent episodes of fresh blood when she wiping for the last 4 years. Does not know if there was blood on top or mixed with the stool.  Notably, she states that the bleeding has not been more frequent than in the past.  The patient denies having any nausea, vomiting, fever, chills, hematochezia, melena, hematemesis, abdominal distention, abdominal pain, diarrhea, jaundice, pruritus or weight loss.  Last O[facility]  Last Colonoscopy:never  FHx: neg for any gastrointestinal/liver disease, no malignancies Social: neg smoking, alcohol or illicit drug use Surgical: cholecystectomy, c-section x3  Past Medical History: Past Medical History:   Diagnosis Date  . Chronic kidney disease    kidney stones  . GERD (gastroesophageal reflux disease)   . Gestational diabetes    gestational  . History of kidney stones   . PONV (postoperative nausea and vomiting)   . Pre-eclampsia    with pregnancy  . UTI (lower urinary tract infection)     Past Surgical History: Past Surgical History:  Procedure Laterality Date  . CESAREAN SECTION    . CESAREAN SECTION WITH BILATERAL TUBAL LIGATION Bilateral 12/14/2016   Procedure: CESAREAN SECTION WITH BILATERAL TUBAL LIGATION;  Surgeon: Tilda Burrow, MD;  Location: [facility] BIRTHING SUIT[facility];  Service: Obstetrics;  Laterality: Bilateral;  . CHOLECYSTECTOMY N/A 04/10/2017   Procedure: LAPAROSCOPIC CHOLECYSTECTOMY;  Surgeon: Franky Macho, MD;  Location: [facility];  Service: General;  Laterality: N/A;    Family History: Family History  Problem Relation Age of Onset  . Aneurysm Paternal Grandfather        brain  . Dementia Maternal Grandfather   . Diabetes Mother     Social History: Social History   Tobacco Use  Smoking Status Former Smoker  . Packs/day: 0.25  . Years: 2.00  . Pack years: 0.50  . Types: Cigarettes  . Quit date: 04/03/2009  . Years since quitting: 11.3  Smokeless Tobacco Never Used   Social History   Substance and Sexual Activity  Alcohol Use No   Social History   Substance and Sexual Activity  Drug Use No    Allergies: No Known Allergies  Medications: Current Outpatient Medications  Medication Sig Dispense Refill  . topiramate (TOPAMAX) 50 MG tablet Take 50 mg by mouth 2 (two) times  daily.     No current facility-administered medications for this visit.    Review of Systems: GENERAL: negative for malaise, night sweats HEENT: No changes in hearing or vision, no nose bleeds or other nasal problems. NECK: Negative for lumps, goiter, pain and significant neck swelling RESPIRATORY: Negative for cough, wheezing CARDIOVASCULAR: Negative for chest pain, leg  swelling, palpitations, orthopnea GI: SEE HPI MUSCULOSKELETAL: Negative for joint pain or swelling, back pain, and muscle pain. SKIN: Negative for lesions, rash PSYCH: Negative for sleep disturbance, mood disorder and recent psychosocial stressors. HEMATOLOGY Negative for prolonged bleeding, bruising easily, and swollen nodes. ENDOCRINE: Negative for cold or heat intolerance, polyuria, polydipsia and goiter. NEURO: negative for tremor, gait imbalance, syncope and seizures. The remainder of the review of systems is noncontributory.   Physical Exam: BP 123/78 (BP Location: Left Arm, Patient Position: Sitting, Cuff Size: Large)   Pulse 62   Temp 98.2 F (36.8 C) (Oral)   Ht 5\' 4"  (1.626 m)   Wt 196 lb (88.9 kg)   BMI 33.64 kg/m  GENERAL: The patient is AO x3, in no acute distress.  Obese. HEENT: Head is normocephalic and atraumatic. EOMI are intact. Mouth is well hydrated and without lesions. NECK: Supple. No masses LUNGS: Clear to auscultation. No presence of rhonchi/wheezing/rales. Adequate chest expansion HEART: RRR, normal s1 and s2. ABDOMEN: Soft, nontender, no guarding, no peritoneal signs, and nondistended. BS +. No masses. EXTREMITIES: Without any cyanosis, clubbing, rash, lesions or edema. NEUROLOGIC: AOx3, no focal motor deficit. SKIN: no jaundice, no rashes   Imaging/Labs: as above  I personally reviewed and interpreted the available labs, imaging and endoscopic files.  Impression and Plan: Sherry Flores is a 40 y.o. female with past medical history of GERD, urolithiasis, recurrent UTIs, who presents for evaluation of abdominal pain and rectal bleeding.  The patient has presented intermittent episodes of abdominal pain without presence of new red flag signs, as she has had chronic episodes of rectal bleeding only when wiping.  It is very possible that her symptoms are secondary to IBS-D as she has diarrhea and exacerbation of her symptoms when stressed.  She would  benefit from taking Bentyl as needed.  Will check for celiac disease serology.  Nevertheless, given the presence of chronic rectal bleeding, will proceed with a colonoscopy to rule out any concerning organic pathology.  Finally, in terms of her GERD, she may benefit of using Pepcid as needed as her symptoms are very sporadic since she has been losing weight and making adequate lifestyle changes.  - Schedule colonoscopy - Start Bentyl 10 mg every 12 hours as needed for abdominal pain - Check TTG IgA and IgA - Pepcid as needed for episodes of heartburn  All questions were answered.      20, MD Gastroenterology and Hepatology [facility]

## 2020-08-01 NOTE — Patient Instructions (Addendum)
Schedule colonoscopy Start Bentyl 10 mg every 12 hours as needed for abdominal pain Perform blood workup Can take Pepcid as needed for episodes of heartburn

## 2020-08-01 NOTE — Progress Notes (Signed)
Sherry Flores, M.D. Gastroenterology & Hepatology [facility] [address] [place], [place] [address] Primary Care Physician: Naquan Garman, Terry G, MD [address] [place] [address]  Referring MD: Dr. Terry G Caitriona Sundquist  Chief Complaint:  Abdominal pain, rectal bleeding  History of Present Illness: Sherry Flores is a 40 y.o. female with past medical history of GERD, urolithiasis, recurrent UTIs, who presents for evaluation of abdominal pain and rectal bleeding.  Patient reports that for the last 3 months she has presented intermittent episodes of abdominal pain in the mid abdomen that did not radiate anywhere else, described as a pressure. Pain was intermittent, lasting for a couple of hours. She believes having a BM helped her ease the pain. Her last episode was a couple of weeks, now is less frequent than before. She also noted having very watery BMs, possibly once or twice a week, specially when she gets stressed. She has been losing weight on purpose by changing her diet and exercising.  Patient has a chronic history of GERD, has an episode of heartburn once a month after changing her lifestyle habits. Takes milk for breakthrough symptoms but is not taking any medication.  Patient states she has had intermittent episodes of fresh blood when she wiping for the last 4 years. Does not know if there was blood on top or mixed with the stool.  Notably, she states that the bleeding has not been more frequent than in the past.  The patient denies having any nausea, vomiting, fever, chills, hematochezia, melena, hematemesis, abdominal distention, abdominal pain, diarrhea, jaundice, pruritus or weight loss.  Last EGD:never  Last Colonoscopy:never  FHx: neg for any gastrointestinal/liver disease, no malignancies Social: neg smoking, alcohol or illicit drug use Surgical: cholecystectomy, c-section x3  Past Medical History: Past Medical History:   Diagnosis Date  . Chronic kidney disease    kidney stones  . GERD (gastroesophageal reflux disease)   . Gestational diabetes    gestational  . History of kidney stones   . PONV (postoperative nausea and vomiting)   . Pre-eclampsia    with pregnancy  . UTI (lower urinary tract infection)     Past Surgical History: Past Surgical History:  Procedure Laterality Date  . CESAREAN SECTION    . CESAREAN SECTION WITH BILATERAL TUBAL LIGATION Bilateral 12/14/2016   Procedure: CESAREAN SECTION WITH BILATERAL TUBAL LIGATION;  Surgeon: Ferguson, John V, MD;  Location: [facility]TH[facility];  Service: Obstetrics;  Laterality: Bilateral;  . CHOLECYSTECTOMY N/A 04/10/2017   Procedure: LAPAROSCOPIC CHOLECYSTECTOMY;  Surgeon: Jenkins, Mark, MD;  Location: [facility];  Service: General;  Laterality: N/A;    Family History: Family History  Problem Relation Age of Onset  . Aneurysm Paternal Grandfather        brain  . Dementia Maternal Grandfather   . Diabetes Mother     Social History: Social History   Tobacco Use  Smoking Status Former Smoker  . Packs/day: 0.25  . Years: 2.00  . Pack years: 0.50  . Types: Cigarettes  . Quit date: 04/03/2009  . Years since quitting: 11.3  Smokeless Tobacco Never Used   Social History   Substance and Sexual Activity  Alcohol Use No   Social History   Substance and Sexual Activity  Drug Use No    Allergies: No Known Allergies  Medications: Current Outpatient Medications  Medication Sig Dispense Refill  . topiramate (TOPAMAX) 50 MG tablet Take 50 mg by mouth 2 (two) times   daily.     No current facility-administered medications for this visit.    Review of Systems: GENERAL: negative for malaise, night sweats HEENT: No changes in hearing or vision, no nose bleeds or other nasal problems. NECK: Negative for lumps, goiter, pain and significant neck swelling RESPIRATORY: Negative for cough, wheezing CARDIOVASCULAR: Negative for chest pain, leg  swelling, palpitations, orthopnea GI: SEE HPI MUSCULOSKELETAL: Negative for joint pain or swelling, back pain, and muscle pain. SKIN: Negative for lesions, rash PSYCH: Negative for sleep disturbance, mood disorder and recent psychosocial stressors. HEMATOLOGY Negative for prolonged bleeding, bruising easily, and swollen nodes. ENDOCRINE: Negative for cold or heat intolerance, polyuria, polydipsia and goiter. NEURO: negative for tremor, gait imbalance, syncope and seizures. The remainder of the review of systems is noncontributory.   Physical Exam: BP 123/78 (BP Location: Left Arm, Patient Position: Sitting, Cuff Size: Large)   Pulse 62   Temp 98.2 F (36.8 C) (Oral)   Ht 5\' 4"  (1.626 m)   Wt 196 lb (88.9 kg)   BMI 33.64 kg/m  GENERAL: The patient is AO x3, in no acute distress.  Obese. HEENT: Head is normocephalic and atraumatic. EOMI are intact. Mouth is well hydrated and without lesions. NECK: Supple. No masses LUNGS: Clear to auscultation. No presence of rhonchi/wheezing/rales. Adequate chest expansion HEART: RRR, normal s1 and s2. ABDOMEN: Soft, nontender, no guarding, no peritoneal signs, and nondistended. BS +. No masses. EXTREMITIES: Without any cyanosis, clubbing, rash, lesions or edema. NEUROLOGIC: AOx3, no focal motor deficit. SKIN: no jaundice, no rashes   Imaging/Labs: as above  I personally reviewed and interpreted the available labs, imaging and endoscopic files.  Impression and Plan: Sherry Flores is a 40 y.o. female with past medical history of GERD, urolithiasis, recurrent UTIs, who presents for evaluation of abdominal pain and rectal bleeding.  The patient has presented intermittent episodes of abdominal pain without presence of new red flag signs, as she has had chronic episodes of rectal bleeding only when wiping.  It is very possible that her symptoms are secondary to IBS-D as she has diarrhea and exacerbation of her symptoms when stressed.  She would  benefit from taking Bentyl as needed.  Will check for celiac disease serology.  Nevertheless, given the presence of chronic rectal bleeding, will proceed with a colonoscopy to rule out any concerning organic pathology.  Finally, in terms of her GERD, she may benefit of using Pepcid as needed as her symptoms are very sporadic since she has been losing weight and making adequate lifestyle changes.  - Schedule colonoscopy - Start Bentyl 10 mg every 12 hours as needed for abdominal pain - Check TTG IgA and IgA - Pepcid as needed for episodes of heartburn  All questions were answered.      20, MD Gastroenterology and Hepatology [facility]

## 2020-08-01 NOTE — Telephone Encounter (Signed)
LeighAnn Toya Palacios, CMA  

## 2020-08-04 ENCOUNTER — Other Ambulatory Visit (INDEPENDENT_AMBULATORY_CARE_PROVIDER_SITE_OTHER): Payer: Self-pay

## 2020-08-08 ENCOUNTER — Other Ambulatory Visit (HOSPITAL_COMMUNITY)
Admission: RE | Admit: 2020-08-08 | Discharge: 2020-08-08 | Disposition: A | Discharge status: Home / Self Care | Payer: No Typology Code available for payment source | Source: Ambulatory Visit | Attending: Gastroenterology | Admitting: Gastroenterology

## 2020-08-08 ENCOUNTER — Other Ambulatory Visit: Payer: Self-pay

## 2020-08-08 DIAGNOSIS — K648 Other hemorrhoids: Secondary | ICD-10-CM | POA: Diagnosis not present

## 2020-08-08 DIAGNOSIS — N189 Chronic kidney disease, unspecified: Secondary | ICD-10-CM | POA: Diagnosis not present

## 2020-08-08 DIAGNOSIS — R109 Unspecified abdominal pain: Secondary | ICD-10-CM | POA: Diagnosis present

## 2020-08-08 DIAGNOSIS — K219 Gastro-esophageal reflux disease without esophagitis: Secondary | ICD-10-CM | POA: Diagnosis not present

## 2020-08-08 DIAGNOSIS — Z01812 Encounter for preprocedural laboratory examination: Secondary | ICD-10-CM | POA: Insufficient documentation

## 2020-08-08 DIAGNOSIS — Z87442 Personal history of urinary calculi: Secondary | ICD-10-CM | POA: Diagnosis not present

## 2020-08-08 DIAGNOSIS — Z87891 Personal history of nicotine dependence: Secondary | ICD-10-CM | POA: Diagnosis not present

## 2020-08-08 DIAGNOSIS — K625 Hemorrhage of anus and rectum: Secondary | ICD-10-CM | POA: Diagnosis not present

## 2020-08-08 DIAGNOSIS — Z9049 Acquired absence of other specified parts of digestive tract: Secondary | ICD-10-CM | POA: Diagnosis not present

## 2020-08-08 DIAGNOSIS — Z8759 Personal history of other complications of pregnancy, childbirth and the puerperium: Secondary | ICD-10-CM | POA: Diagnosis not present

## 2020-08-08 DIAGNOSIS — Z20822 Contact with and (suspected) exposure to covid-19: Secondary | ICD-10-CM | POA: Diagnosis not present

## 2020-08-08 DIAGNOSIS — Z79899 Other long term (current) drug therapy: Secondary | ICD-10-CM | POA: Diagnosis not present

## 2020-08-08 DIAGNOSIS — K580 Irritable bowel syndrome with diarrhea: Secondary | ICD-10-CM | POA: Insufficient documentation

## 2020-08-08 LAB — PREGNANCY, URINE: Preg Test, Ur: NEGATIVE

## 2020-08-08 LAB — SARS CORONAVIRUS 2 (TAT 6-24 HRS): SARS Coronavirus 2: NEGATIVE

## 2020-08-09 ENCOUNTER — Ambulatory Visit (HOSPITAL_COMMUNITY)
Admission: RE | Admit: 2020-08-09 | Discharge: 2020-08-09 | Disposition: A | Discharge status: Home / Self Care | Payer: No Typology Code available for payment source | Attending: Gastroenterology | Admitting: Gastroenterology

## 2020-08-09 ENCOUNTER — Ambulatory Visit (HOSPITAL_COMMUNITY): Payer: No Typology Code available for payment source | Admitting: Anesthesiology

## 2020-08-09 ENCOUNTER — Encounter (HOSPITAL_COMMUNITY)
Admission: RE | Disposition: A | Discharge status: Home / Self Care | Payer: Self-pay | Source: Home / Self Care | Attending: Gastroenterology

## 2020-08-09 ENCOUNTER — Other Ambulatory Visit: Payer: Self-pay

## 2020-08-09 ENCOUNTER — Encounter (HOSPITAL_COMMUNITY): Payer: Self-pay | Admitting: Gastroenterology

## 2020-08-09 DIAGNOSIS — R109 Unspecified abdominal pain: Secondary | ICD-10-CM | POA: Diagnosis not present

## 2020-08-09 DIAGNOSIS — Z9049 Acquired absence of other specified parts of digestive tract: Secondary | ICD-10-CM | POA: Insufficient documentation

## 2020-08-09 DIAGNOSIS — Z87442 Personal history of urinary calculi: Secondary | ICD-10-CM | POA: Insufficient documentation

## 2020-08-09 DIAGNOSIS — Z87891 Personal history of nicotine dependence: Secondary | ICD-10-CM | POA: Insufficient documentation

## 2020-08-09 DIAGNOSIS — R1084 Generalized abdominal pain: Secondary | ICD-10-CM

## 2020-08-09 DIAGNOSIS — K648 Other hemorrhoids: Secondary | ICD-10-CM | POA: Insufficient documentation

## 2020-08-09 DIAGNOSIS — K625 Hemorrhage of anus and rectum: Secondary | ICD-10-CM | POA: Insufficient documentation

## 2020-08-09 DIAGNOSIS — K580 Irritable bowel syndrome with diarrhea: Secondary | ICD-10-CM

## 2020-08-09 DIAGNOSIS — N189 Chronic kidney disease, unspecified: Secondary | ICD-10-CM | POA: Insufficient documentation

## 2020-08-09 DIAGNOSIS — K219 Gastro-esophageal reflux disease without esophagitis: Secondary | ICD-10-CM | POA: Insufficient documentation

## 2020-08-09 DIAGNOSIS — Z8759 Personal history of other complications of pregnancy, childbirth and the puerperium: Secondary | ICD-10-CM | POA: Insufficient documentation

## 2020-08-09 DIAGNOSIS — Z79899 Other long term (current) drug therapy: Secondary | ICD-10-CM | POA: Insufficient documentation

## 2020-08-09 HISTORY — PX: COLONOSCOPY WITH PROPOFOL: SHX5780

## 2020-08-09 LAB — TISSUE TRANSGLUTAMINASE, IGA: (tTG) Ab, IgA: <1 U/mL

## 2020-08-09 LAB — IGA: Immunoglobulin A: 126 mg/dL (ref 47–310)

## 2020-08-09 SURGERY — COLONOSCOPY WITH PROPOFOL
Anesthesia: General

## 2020-08-09 MED ORDER — STERILE WATER FOR IRRIGATION IR SOLN
Status: DC | PRN
  Administered 2020-08-09: 100 mL

## 2020-08-09 MED ORDER — GLYCOPYRROLATE 0.2 MG/ML IJ SOLN
INTRAMUSCULAR | Status: DC | PRN
  Administered 2020-08-09: .1 mg via INTRAVENOUS

## 2020-08-09 MED ORDER — LACTATED RINGERS IV SOLN: INTRAVENOUS | Status: DC

## 2020-08-09 MED ORDER — PROPOFOL 500 MG/50ML IV EMUL
INTRAVENOUS | Status: DC | PRN
  Administered 2020-08-09: 150 ug/kg/min via INTRAVENOUS

## 2020-08-09 MED ORDER — KETAMINE HCL 50 MG/5ML IJ SOSY
PREFILLED_SYRINGE | INTRAMUSCULAR | Status: AC
  Filled 2020-08-09: qty 5

## 2020-08-09 MED ORDER — PROPOFOL 10 MG/ML IV BOLUS
INTRAVENOUS | Status: DC | PRN
  Administered 2020-08-09: 40 mg via INTRAVENOUS

## 2020-08-09 MED ORDER — KETAMINE HCL 10 MG/ML IJ SOLN
INTRAMUSCULAR | Status: DC | PRN
  Administered 2020-08-09: 15 mg via INTRAVENOUS

## 2020-08-09 MED ORDER — GLYCOPYRROLATE PF 0.2 MG/ML IJ SOSY
PREFILLED_SYRINGE | INTRAMUSCULAR | Status: AC
  Filled 2020-08-09: qty 1

## 2020-08-09 NOTE — Interval H&P Note (Signed)
History and Physical Interval Note:  08/09/2020 9:59 AM  Sherry Flores is a 40 y.o. female with past medical history of GERD, urolithiasis, recurrent UTIs, who presents to the hospital for evaluation of abdominal pain and rectal bleeding.  The patient states that since the last time she was seen in clinic she has not had any more episodes of rectal bleeding.  Her abdominal pain has improved and has required very little intake of mental.  She is having a bowel movement daily and denies having any nausea, vomiting, fever, chills, melena or hematochezia.  BP 118/69   Pulse 71   Temp 98.2 F (36.8 C) (Oral)   Resp (!) 22   SpO2 99%  GENERAL: The patient is AO x3, in no acute distress. HEENT: Head is normocephalic and atraumatic. EOMI are intact. Mouth is well hydrated and without lesions. NECK: Supple. No masses LUNGS: Clear to auscultation. No presence of rhonchi/wheezing/rales. Adequate chest expansion HEART: RRR, normal s1 and s2. ABDOMEN: Soft, nontender, no guarding, no peritoneal signs, and nondistended. BS +. No masses. EXTREMITIES: Without any cyanosis, clubbing, rash, lesions or edema. NEUROLOGIC: AOx3, no focal motor deficit. SKIN: no jaundice, no rashes  Sherry Flores  has presented today for surgery, with the diagnosis of Rectal Bleeding.  The various methods of treatment have been discussed with the patient and family. After consideration of risks, benefits and other options for treatment, the patient has consented to  Procedure(s) with comments: COLONOSCOPY WITH PROPOFOL (N/A) - 10:15 as a surgical intervention.  The patient's history has been reviewed, patient examined, no change in status, stable for surgery.  I have reviewed the patient's chart and labs.  Questions were answered to the patient's satisfaction.     Katrinka Blazing Mayorga

## 2020-08-09 NOTE — Transfer of Care (Signed)
Immediate Anesthesia Transfer of Care Note  Patient: Sherry Flores  Procedure(s) Performed: COLONOSCOPY WITH PROPOFOL (N/A )  Patient Location: PACU  Anesthesia Type:General  Level of Consciousness: awake  Airway & Oxygen Therapy: Patient Spontanous Breathing  Post-op Assessment: Report given to RN and Post -op Vital signs reviewed and stable  Post vital signs: Reviewed and stable  Last Vitals:  Vitals Value Taken Time  BP    Temp    Pulse    Resp    SpO2      Last Pain:  Vitals:   08/09/20 1105  TempSrc:   PainSc: 0-No pain         Complications: No complications documented.

## 2020-08-09 NOTE — Anesthesia Preprocedure Evaluation (Addendum)
Anesthesia Evaluation  Patient identified by MRN, date of birth, ID band Patient awake    Reviewed: Allergy & Precautions, NPO status , Patient's Chart, lab work & pertinent test results  History of Anesthesia Complications (+) PONV and history of anesthetic complications  Airway Mallampati: II  TM Distance: >3 FB Neck ROM: Full    Dental  (+) Dental Advisory Given, Teeth Intact   Pulmonary former smoker,    Pulmonary exam normal breath sounds clear to auscultation       Cardiovascular Exercise Tolerance: Good hypertension, Pt. on medications Normal cardiovascular exam Rhythm:Regular Rate:Normal     Neuro/Psych    GI/Hepatic GERD  Controlled,  Endo/Other  diabetes  Renal/GU Renal disease (stones)     Musculoskeletal negative musculoskeletal ROS (+)   Abdominal   Peds  Hematology negative hematology ROS (+)   Anesthesia Other Findings   Reproductive/Obstetrics negative OB ROS                            Anesthesia Physical Anesthesia Plan  ASA: II  Anesthesia Plan: General   Post-op Pain Management:    Induction: Intravenous  PONV Risk Score and Plan: TIVA  Airway Management Planned: Nasal Cannula and Natural Airway  Additional Equipment:   Intra-op Plan:   Post-operative Plan:   Informed Consent: I have reviewed the patients History and Physical, chart, labs and discussed the procedure including the risks, benefits and alternatives for the proposed anesthesia with the patient or authorized representative who has indicated his/her understanding and acceptance.     Dental advisory given  Plan Discussed with: CRNA and Surgeon  Anesthesia Plan Comments:         Anesthesia Quick Evaluation

## 2020-08-09 NOTE — Op Note (Signed)
[facility] Patient Name: Sherry Flores Procedure Date: 08/09/2020 10:54 AM MRN: [id number] Date of Birth: 01-Sep-1980 Attending MD: Katrinka Blazing ,  CSN: [id number] Age: 40 Admit Type: Outpatient Procedure:                Colonoscopy Indications:              Abdominal pain, Rectal bleeding Providers:                Katrinka Blazing, Sheran Fava, Dyann Ruddle Referring MD:              Medicines:                Monitored Anesthesia Care Complications:            No immediate complications. Estimated Blood Loss:     Estimated blood loss: none. Procedure:                Pre-Anesthesia Assessment:                           - Prior to the procedure, a History and Physical                            was performed, and patient medications, allergies                            and sensitivities were reviewed. The patient's                            tolerance of previous anesthesia was reviewed.                           - The risks and benefits of the procedure and the                            sedation options and risks were discussed with the                            patient. All questions were answered and informed                            consent was obtained.                           - ASA Grade Assessment: II - A patient with mild                            systemic disease.                           After obtaining informed consent, the colonoscope                            was passed under direct vision. Throughout the                            procedure, the patient's blood pressure, pulse, and  oxygen saturations were monitored continuously.The                            colonoscopy was performed without difficulty. The                            patient tolerated the procedure well. The                            [id number]-[id number] ([id number]) scope was introduced through                            the anus and advanced to the the  terminal ileum.                            The quality of the bowel preparation was fair.                            Scope withdrawal time was 11 minutes. Scope In: 11:11:58 AM Scope Out: 11:29:01 AM Scope Withdrawal Time: 0 hours 12 minutes 5 seconds  Total Procedure Duration: 0 hours 17 minutes 3 seconds  Findings:      The perianal and digital rectal examinations were normal.      The terminal ileum appeared normal.      The colon (entire examined portion) appeared normal.      Non-bleeding internal hemorrhoids were found during retroflexion. The       hemorrhoids were small. Impression:               - Preparation of the colon was fair.                           - The examined portion of the ileum was normal.                           - The entire examined colon is normal.                           - Non-bleeding internal hemorrhoids.                           - No specimens collected. Moderate Sedation:      Per Anesthesia Care Recommendation:           - Discharge patient to home (ambulatory).                           - Resume previous diet.                           - Repeat colonoscopy at age 11 for screening                            purposes. Procedure Code(s):        --- Professional ---  69629, Colonoscopy, flexible; diagnostic, including                            collection of specimen(s) by brushing or washing,                            when performed (separate procedure) Diagnosis Code(s):        --- Professional ---                           K64.8, Other hemorrhoids                           R10.9, Unspecified abdominal pain                           K62.5, Hemorrhage of anus and rectum CPT copyright 2019 American Medical Association. All rights reserved. The codes documented in this report are preliminary and upon coder review may  be revised to meet current compliance requirements. Katrinka Blazing, MD Katrinka Blazing,  08/09/2020  11:33:12 AM This report has been signed electronically. Number of Addenda: 0

## 2020-08-09 NOTE — Discharge Instructions (Signed)
Colonoscopy, Adult, Care After This sheet gives you information about how to care for yourself after your procedure. Your doctor may also give you more specific instructions. If you have problems or questions, call your doctor. What can I expect after the procedure? After the procedure, it is common to have:  A small amount of blood in your poop (stool) for 24 hours.  Some gas.  Mild cramping or bloating in your belly (abdomen). Follow these instructions at home: Eating and drinking  Drink enough fluid to keep your pee (urine) pale yellow.  Follow instructions from your doctor about what you cannot eat or drink.  Return to your normal diet as told by your doctor. Avoid heavy or fried foods that are hard to digest.   Activity  Rest as told by your doctor.  Do not sit for a long time without moving. Get up to take short walks every 1-2 hours. This is important. Ask for help if you feel weak or unsteady.  Return to your normal activities as told by your doctor. Ask your doctor what activities are safe for you. To help cramping and bloating:  Try walking around.  Put heat on your belly as told by your doctor. Use the heat source that your doctor recommends, such as a moist heat pack or a heating pad. ? Put a towel between your skin and the heat source. ? Leave the heat on for 20-30 minutes. ? Remove the heat if your skin turns bright red. This is very important if you are unable to feel pain, heat, or cold. You may have a greater risk of getting burned.   General instructions  If you were given a medicine to help you relax (sedative) during your procedure, it can affect you for many hours. Do not drive or use machinery until your doctor says that it is safe.  For the first 24 hours after the procedure: ? Do not sign important documents. ? Do not drink alcohol. ? Do your daily activities more slowly than normal. ? Eat foods that are soft and easy to digest.  Take  over-the-counter or prescription medicines only as told by your doctor.  Keep all follow-up visits as told by your doctor. This is important. Contact a doctor if:  You have blood in your poop 2-3 days after the procedure. Get help right away if:  You have more than a small amount of blood in your poop.  You see large clumps of tissue (blood clots) in your poop.  Your belly is swollen.  You feel like you may vomit (nauseous).  You vomit.  You have a fever.  You have belly pain that gets worse, and medicine does not help your pain. Summary  After the procedure, it is common to have a small amount of blood in your poop. You may also have mild cramping and bloating in your belly.  If you were given a medicine to help you relax (sedative) during your procedure, it can affect you for many hours. Do not drive or use machinery until your doctor says that it is safe.  Get help right away if you have a lot of blood in your poop, feel like you may vomit, have a fever, or have more belly pain. This information is not intended to replace advice given to you by your health care provider. Make sure you discuss any questions you have with your health care provider. Document Revised: 04/17/2019 Document Reviewed: 01/05/2019 Elsevier Patient Education  2021  Elsevier Inc. Hemorrhoids Hemorrhoids are swollen veins that may develop:  In the butt (rectum). These are called internal hemorrhoids.  Around the opening of the butt (anus). These are called external hemorrhoids. Hemorrhoids can cause pain, itching, or bleeding. Most of the time, they do not cause serious problems. They usually get better with diet changes, lifestyle changes, and other home treatments. What are the causes? This condition may be caused by:  Having trouble pooping (constipation).  Pushing hard (straining) to poop.  Watery poop (diarrhea).  Pregnancy.  Being very overweight (obese).  Sitting for long periods of  time.  Heavy lifting or other activity that causes you to strain.  Anal sex.  Riding a bike for a long period of time. What are the signs or symptoms? Symptoms of this condition include:  Pain.  Itching or soreness in the butt.  Bleeding from the butt.  Leaking poop.  Swelling in the area.  One or more lumps around the opening of your butt. How is this diagnosed? A doctor can often diagnose this condition by looking at the affected area. The doctor may also:  Do an exam that involves feeling the area with a gloved hand (digital rectal exam).  Examine the area inside your butt using a small tube (anoscope).  Order blood tests. This may be done if you have lost a lot of blood.  Have you get a test that involves looking inside the colon using a flexible tube with a camera on the end (sigmoidoscopy or colonoscopy). How is this treated? This condition can usually be treated at home. Your doctor may tell you to change what you eat, make lifestyle changes, or try home treatments. If these do not help, procedures can be done to remove the hemorrhoids or make them smaller. These may involve:  Placing rubber bands at the base of the hemorrhoids to cut off their blood supply.  Injecting medicine into the hemorrhoids to shrink them.  Shining a type of light energy onto the hemorrhoids to cause them to fall off.  Doing surgery to remove the hemorrhoids or cut off their blood supply. Follow these instructions at home: Eating and drinking  Eat foods that have a lot of fiber in them. These include whole grains, beans, nuts, fruits, and vegetables.  Ask your doctor about taking products that have added fiber (fibersupplements).  Reduce the amount of fat in your diet. You can do this by: ? Eating low-fat dairy products. ? Eating less red meat. ? Avoiding processed foods.  Drink enough fluid to keep your pee (urine) pale yellow.   Managing pain and swelling  Take a warm-water  bath (sitz bath) for 20 minutes to ease pain. Do this 3-4 times a day. You may do this in a bathtub or using a portable sitz bath that fits over the toilet.  If told, put ice on the painful area. It may be helpful to use ice between your warm baths. ? Put ice in a plastic bag. ? Place a towel between your skin and the bag. ? Leave the ice on for 20 minutes, 2-3 times a day.   General instructions  Take over-the-counter and prescription medicines only as told by your doctor. ? Medicated creams and medicines may be used as told.  Exercise often. Ask your doctor how much and what kind of exercise is best for you.  Go to the bathroom when you have the urge to poop. Do not wait.  Avoid pushing too hard when  you poop.  Keep your butt dry and clean. Use wet toilet paper or moist towelettes after pooping.  Do not sit on the toilet for a long time.  Keep all follow-up visits as told by your doctor. This is important. Contact a doctor if you:  Have pain and swelling that do not get better with treatment or medicine.  Have trouble pooping.  Cannot poop.  Have pain or swelling outside the area of the hemorrhoids. Get help right away if you have:  Bleeding that will not stop. Summary  Hemorrhoids are swollen veins in the butt or around the opening of the butt.  They can cause pain, itching, or bleeding.  Eat foods that have a lot of fiber in them. These include whole grains, beans, nuts, fruits, and vegetables.  Take a warm-water bath (sitz bath) for 20 minutes to ease pain. Do this 3-4 times a day. This information is not intended to replace advice given to you by your health care provider. Make sure you discuss any questions you have with your health care provider. Document Revised: 06/19/2018 Document Reviewed: 10/31/2017 Elsevier Patient Education  2021 Elsevier Inc.   You are being discharged to home.  Resume your previous diet.  Your physician has recommended a repeat  colonoscopy at age 71 (please contact the clinic prior to your 46th birthday to schedule) for screening purposes.  Can take Miralax as needed if constipation or recurrent rectal bleeding

## 2020-08-09 NOTE — Anesthesia Postprocedure Evaluation (Signed)
Anesthesia Post Note  Patient: Sherry Flores  Procedure(s) Performed: COLONOSCOPY WITH PROPOFOL (N/A )  Patient location during evaluation: PACU Anesthesia Type: General Level of consciousness: awake Pain management: pain level controlled Vital Signs Assessment: post-procedure vital signs reviewed and stable Respiratory status: spontaneous breathing, nonlabored ventilation and respiratory function stable Cardiovascular status: stable Postop Assessment: no apparent nausea or vomiting Anesthetic complications: no   No complications documented.   Last Vitals:  Vitals:   08/09/20 0937 08/09/20 1133  BP: 118/69 103/69  Pulse: 71 (!) 58  Resp: (!) 22 17  Temp: 36.8 C 36.6 C  SpO2: 99% 100%    Last Pain:  Vitals:   08/09/20 1133  TempSrc: Oral  PainSc: 0-No pain                 Deng Kemler Hristova

## 2020-08-12 ENCOUNTER — Encounter (HOSPITAL_COMMUNITY): Payer: Self-pay | Admitting: Gastroenterology

## 2020-10-25 ENCOUNTER — Other Ambulatory Visit: Payer: Self-pay | Admitting: Adult Health

## 2020-10-25 MED ORDER — NORETHIN ACE-ETH ESTRAD-FE 1-20 MG-MCG PO TABS: 1.0000 | ORAL_TABLET | Freq: Every day | ORAL | 11 refills | Status: DC

## 2020-10-25 NOTE — Progress Notes (Signed)
Will rx junel 1-20 °

## 2020-12-01 ENCOUNTER — Other Ambulatory Visit: Payer: Self-pay | Admitting: Obstetrics & Gynecology

## 2021-06-06 ENCOUNTER — Other Ambulatory Visit: Payer: Self-pay

## 2021-06-06 ENCOUNTER — Ambulatory Visit (INDEPENDENT_AMBULATORY_CARE_PROVIDER_SITE_OTHER): Payer: 59 | Admitting: Adult Health

## 2021-06-06 ENCOUNTER — Encounter: Payer: Self-pay | Admitting: Adult Health

## 2021-06-06 VITALS — BP 107/67 | HR 52 | Ht 64.0 in | Wt 195.0 lb

## 2021-06-06 DIAGNOSIS — N946 Dysmenorrhea, unspecified: Secondary | ICD-10-CM | POA: Diagnosis not present

## 2021-06-06 DIAGNOSIS — Z01419 Encounter for gynecological examination (general) (routine) without abnormal findings: Secondary | ICD-10-CM | POA: Diagnosis not present

## 2021-06-06 MED ORDER — NORETHIN ACE-ETH ESTRAD-FE 1-20 MG-MCG PO TABS: 1.0000 | ORAL_TABLET | Freq: Every day | ORAL | 4 refills | Status: DC

## 2021-06-06 NOTE — Progress Notes (Signed)
Patient ID: MELANE WINDHOLZ, female   DOB: 04-Aug-1980, 40 y.o.   MRN: [id number] History of Present Illness: Zandra is a 40 year old white female, married, G3P3 in for a well woman gyn exam. Lab Results  Component Value Date   DIAGPAP  06/03/2020    - Negative for intraepithelial lesion or malignancy (NILM)   HPV NOT DETECTED 06/10/2017   HPVHIGH Negative 06/03/2020    PCP is Dr Reuel Boom.  Current Medications, Allergies, Past Medical History, Past Surgical History, Family History and Social History were reviewed in [employer] record.     Review of Systems: Patient denies any headaches, hearing loss, fatigue, blurred vision, shortness of breath, chest pain, abdominal pain, problems with bowel movements, urination, or intercourse. No joint pain or mood swings.  Last 2 months period, 1/2 day, and uses OCs to stop period pain and it works  Physical Exam:BP 107/67 (BP Location: Left Arm, Patient Position: Sitting, Cuff Size: Normal)    Pulse (!) 52    Ht 5\' 4"  (1.626 m)    Wt 195 lb (88.5 kg)    LMP 05/22/2021 (Approximate)    BMI 33.47 kg/m   General:  Well developed, well nourished, no acute distress Skin:  Warm and dry Neck:  Midline trachea, normal thyroid, good ROM, no lymphadenopathy Lungs; Clear to auscultation bilaterally Breast:  No dominant palpable mass, retraction, or nipple discharge Cardiovascular: Regular rate and rhythm Abdomen:  Soft, non tender, no hepatosplenomegaly Pelvic:  External genitalia is normal in appearance, no lesions.  The vagina is normal in appearance. Urethra has no lesions or masses. The cervix is bulbous.  Uterus is felt to be normal size, shape, and contour.  No adnexal masses or tenderness noted.Bladder is non tender, no masses felt. Rectal: Deferred Extremities/musculoskeletal:  No swelling or varicosities noted, no clubbing or cyanosis Psych:  No mood changes, alert and cooperative,seems happy AA is 2 Fall risk is  low Depression screen [facility] 2/9 06/06/2021 06/03/2020 08/06/2018  Decreased Interest 0 0 0  Down, Depressed, Hopeless 0 0 0  PHQ - 2 Score 0 0 0  Altered sleeping 1 1 -  Tired, decreased energy 1 1 -  Change in appetite 0 1 -  Feeling bad or failure about yourself  0 1 -  Trouble concentrating 0 0 -  Moving slowly or fidgety/restless 0 0 -  Suicidal thoughts 0 0 -  PHQ-9 Score 2 4 -    GAD 7 : Generalized Anxiety Score 06/06/2021 06/03/2020  Nervous, Anxious, on Edge 0 1  Control/stop worrying 0 0  Worry too much - different things 0 1  Trouble relaxing 0 1  Restless 0 0  Easily annoyed or irritable 0 0  Afraid - awful might happen 0 0  Total GAD 7 Score 0 3      Upstream - 06/06/21 1343       Pregnancy Intention Screening   Does the patient want to become pregnant in the next year? No    Does the patient's partner want to become pregnant in the next year? No    Would the patient like to discuss contraceptive options today? No      Contraception Wrap Up   Current Method Oral Contraceptive;Female Sterilization    End Method Oral Contraceptive;Female Sterilization    Contraception Counseling Provided No           .Examination chaperoned by 06/08/21 RN  Impression and Plan: 1. Encounter for well woman exam  with routine gynecological exam Physical in 1 year Pap in 2024  2. Dysmenorrhea,resolved with OCs Meds ordered this encounter  Medications   norethindrone-ethinyl estradiol-FE (LOESTRIN FE) 1-20 MG-MCG tablet    Sig: Take 1 tablet by mouth daily.    Dispense:  84 tablet    Refill:  4    Order Specific Question:   Supervising Provider    Answer:   Duane Lope H [[id number]]

## 2022-05-03 ENCOUNTER — Other Ambulatory Visit: Payer: Self-pay | Admitting: Obstetrics & Gynecology

## 2022-05-08 ENCOUNTER — Encounter: Payer: Self-pay | Admitting: Adult Health

## 2022-05-08 ENCOUNTER — Ambulatory Visit (INDEPENDENT_AMBULATORY_CARE_PROVIDER_SITE_OTHER): Payer: Commercial Managed Care - PPO | Admitting: Adult Health

## 2022-05-08 VITALS — BP 130/70 | HR 53 | Ht 64.0 in | Wt 204.0 lb

## 2022-05-08 DIAGNOSIS — N760 Acute vaginitis: Secondary | ICD-10-CM | POA: Insufficient documentation

## 2022-05-08 DIAGNOSIS — N946 Dysmenorrhea, unspecified: Secondary | ICD-10-CM | POA: Diagnosis not present

## 2022-05-08 DIAGNOSIS — B9689 Other specified bacterial agents as the cause of diseases classified elsewhere: Secondary | ICD-10-CM

## 2022-05-08 DIAGNOSIS — N898 Other specified noninflammatory disorders of vagina: Secondary | ICD-10-CM

## 2022-05-08 LAB — POCT WET PREP (WET MOUNT)
Clue Cells Wet Prep Whiff POC: POSITIVE
WBC, Wet Prep HPF POC: POSITIVE

## 2022-05-08 MED ORDER — NORETHINDRONE 0.35 MG PO TABS
1.0000 | ORAL_TABLET | Freq: Every day | ORAL | 11 refills | Status: DC
Start: 2022-05-08 — End: 2022-08-07

## 2022-05-08 MED ORDER — METRONIDAZOLE 0.75 % VA GEL: 1.0000 | Freq: Every day | VAGINAL | 0 refills | Status: DC

## 2022-05-08 NOTE — Progress Notes (Signed)
Subjective:     Patient ID: Sherry Flores, female   DOB: Dec 29, 1980, 41 y.o.   MRN: [id number]  HPI Sherry Flores is a 41 year old white female,married, G3P3 in for complaining of vaginal itching, has been treated recently for UTI was  E coli, and ureaplasma, and gardnerella,Mobiluncus, and yeast, from Urgent Care.She took bactrim, Rocephin, Azithromycin, flagyl and diflucan.   Last pap was 06/03/20 negative HPV and malignancy.  PCP is Dr Reuel Boom. Review of Systems Vaginal itching  Some vaginal discharge Reviewed past medical,surgical, social and family history. Reviewed medications and allergies.     Objective:   Physical Exam BP 130/70 (BP Location: Left Arm, Patient Position: Sitting, Cuff Size: Normal)   Pulse (!) 53   Ht 5\' 4"  (1.626 m)   Wt 204 lb (92.5 kg)   BMI 35.02 kg/m     Skin warm and dry.Pelvic: external genitalia is normal in appearance no lesions, vagina: pink discharge with odor,urethra has no lesions or masses noted, cervix:smooth and bulbous, uterus: normal size, shape and contour, non tender, no masses felt, adnexa: no masses or tenderness noted. Bladder is non tender and no masses felt. Wet prep: + for clue cells and +WBCs. Fall risk is low    05/08/2022    3:19 PM 06/06/2021    1:43 PM 06/03/2020    9:40 AM  Depression screen PHQ 2/9  Decreased Interest 0 0 0  Down, Depressed, Hopeless 0 0 0  PHQ - 2 Score 0 0 0  Altered sleeping  1 1  Tired, decreased energy  1 1  Change in appetite  0 1  Feeling bad or failure about yourself   0 1  Trouble concentrating  0 0  Moving slowly or fidgety/restless  0 0  Suicidal thoughts  0 0  PHQ-9 Score  2 4     Upstream - 05/08/22 1513       Pregnancy Intention Screening   Does the patient want to become pregnant in the next year? No    Does the patient's partner want to become pregnant in the next year? No    Would the patient like to discuss contraceptive options today? No      Contraception Wrap Up   Current  Method Oral Contraceptive;Female Sterilization    End Method Oral Contraceptive;Female Sterilization    Contraception Counseling Provided No    How was the end contraceptive method provided? Prescription            Examination chaperoned by Tish RN   Assessment:     1. Vaginal discharge +pink discharge  2. Vaginal itching +itching   3. BV (bacterial vaginosis) +clue cells on wet prep Will rx Metrogel 1 applicator at hs for 5 nights   4. Dysmenorrhea She is on Loestrin for painful periods and it helps but she is smoking some now, will change to POP  Meds ordered this encounter  Medications   metroNIDAZOLE (METROGEL) 0.75 % vaginal gel    Sig: Place 1 Applicatorful vaginally at bedtime.    Dispense:  70 g    Refill:  0    Order Specific Question:   Supervising Provider    Answer:   05/10/22 H [[id number]]   norethindrone (MICRONOR) 0.35 MG tablet    Sig: Take 1 tablet (0.35 mg total) by mouth daily.    Dispense:  28 tablet    Refill:  11    Order Specific Question:   Supervising Provider    Answer:  Lazaro Arms [[id number]]       Plan:     Return in December for physical and ROS

## 2022-06-22 ENCOUNTER — Ambulatory Visit: Payer: Commercial Managed Care - PPO | Admitting: Adult Health

## 2022-08-07 ENCOUNTER — Encounter: Payer: Self-pay | Admitting: Adult Health

## 2022-08-07 ENCOUNTER — Ambulatory Visit (INDEPENDENT_AMBULATORY_CARE_PROVIDER_SITE_OTHER): Payer: Commercial Managed Care - PPO | Admitting: Adult Health

## 2022-08-07 VITALS — BP 106/68 | HR 51 | Ht 64.0 in | Wt 201.5 lb

## 2022-08-07 DIAGNOSIS — Z1211 Encounter for screening for malignant neoplasm of colon: Secondary | ICD-10-CM | POA: Insufficient documentation

## 2022-08-07 DIAGNOSIS — Z01419 Encounter for gynecological examination (general) (routine) without abnormal findings: Secondary | ICD-10-CM

## 2022-08-07 LAB — HEMOCCULT GUIAC POC 1CARD (OFFICE): Fecal Occult Blood, POC: NEGATIVE

## 2022-08-07 NOTE — Progress Notes (Signed)
Patient ID: Sherry Flores, female   DOB: April 21, 1981, 42 y.o.   MRN: [id number] History of Present Illness: Sherry Flores is a 43 year old white female, married, G3P3003, in for a well woman gyn exam. She wants to stop Micronor, forgets to take any way, was using for ?endometriosis pain.  Last pap was negative HPV,NILM 05/2020.  Current Medications, Allergies, Past Medical History, Past Surgical History, Family History and Social History were reviewed in [employer] record.     Review of Systems: Patient denies any headaches, hearing loss, fatigue, blurred vision, shortness of breath, chest pain, abdominal pain, problems with bowel movements, urination, or intercourse. No joint pain or mood swings.     Physical Exam:BP 106/68 (BP Location: Left Arm, Patient Position: Sitting, Cuff Size: Normal)   Pulse (!) 51   Ht 5' 4"$  (1.626 m)   Wt 201 lb 8 oz (91.4 kg)   LMP 08/02/2022   BMI 34.59 kg/m   General:  Well developed, well nourished, no acute distress Skin:  Warm and dry Neck:  Midline trachea, normal thyroid, good ROM, no lymphadenopathy Lungs; Clear to auscultation bilaterally Breast:  No dominant palpable mass, retraction, or nipple discharge Cardiovascular: Regular rate and rhythm Abdomen:  Soft, non tender, no hepatosplenomegaly Pelvic:  External genitalia is normal in appearance, has skin tag left inner thigh. The vagina is normal in appearance, +dark blood. Urethra has no lesions or masses. The cervix is bulbous.  Uterus is felt to be normal size, shape, and contour.  No adnexal masses or tenderness noted.Bladder is non tender, no masses felt. Rectal: Good sphincter tone, no polyps, or hemorrhoids felt.  Hemoccult negative. Extremities/musculoskeletal:  No swelling or varicosities noted, no clubbing or cyanosis Psych:  No mood changes, alert and cooperative,seems happy AA is 1 Fall risk is low    08/07/2022    8:36 AM 05/08/2022    3:19 PM 06/06/2021     1:43 PM  Depression screen PHQ 2/9  Decreased Interest 0 0 0  Down, Depressed, Hopeless 0 0 0  PHQ - 2 Score 0 0 0  Altered sleeping 1  1  Tired, decreased energy 1  1  Change in appetite 1  0  Feeling bad or failure about yourself  0  0  Trouble concentrating 0  0  Moving slowly or fidgety/restless 0  0  Suicidal thoughts 0  0  PHQ-9 Score 3  2       08/07/2022    8:36 AM 06/06/2021    1:43 PM 06/03/2020    9:41 AM  GAD 7 : Generalized Anxiety Score  Nervous, Anxious, on Edge 0 0 1  Control/stop worrying 0 0 0  Worry too much - different things 1 0 1  Trouble relaxing 1 0 1  Restless 0 0 0  Easily annoyed or irritable 1 0 0  Afraid - awful might happen 0 0 0  Total GAD 7 Score 3 0 3      Upstream - 08/07/22 0835       Pregnancy Intention Screening   Does the patient want to become pregnant in the next year? No    Does the patient's partner want to become pregnant in the next year? No    Would the patient like to discuss contraceptive options today? No      Contraception Wrap Up   Current Method Female Sterilization    End Method Female Sterilization    Contraception Counseling Provided No  Examination chaperoned by Levy Pupa LPN  Impression and plan:d 1. Encounter for well woman exam with routine gynecological exam Pap and physical in 1 year Labs with PCP Mammogram advised   2. Encounter for screening fecal occult blood testing Hemoccult was negative

## 2022-09-03 ENCOUNTER — Other Ambulatory Visit: Payer: Self-pay | Admitting: Adult Health

## 2022-09-03 MED ORDER — ORILISSA 150 MG PO TABS: 150.0000 mg | ORAL_TABLET | Freq: Every day | ORAL | 6 refills | Status: DC

## 2022-09-03 NOTE — Progress Notes (Signed)
Will rx Freida Busman

## 2022-09-04 ENCOUNTER — Other Ambulatory Visit: Payer: Self-pay | Admitting: Adult Health

## 2022-09-04 MED ORDER — NORETHINDRONE 0.35 MG PO TABS: 1.0000 | ORAL_TABLET | Freq: Every day | ORAL | 4 refills | Status: DC

## 2022-09-04 NOTE — Progress Notes (Signed)
Will refill micronor

## 2023-08-13 ENCOUNTER — Ambulatory Visit: Payer: Commercial Managed Care - PPO | Admitting: Adult Health

## 2023-08-13 ENCOUNTER — Encounter: Payer: Self-pay | Admitting: Adult Health

## 2023-08-13 ENCOUNTER — Other Ambulatory Visit (HOSPITAL_COMMUNITY)
Admission: RE | Admit: 2023-08-13 | Discharge: 2023-08-13 | Disposition: A | Discharge status: Home / Self Care | Payer: Commercial Managed Care - PPO | Source: Ambulatory Visit | Attending: Adult Health | Admitting: Adult Health

## 2023-08-13 VITALS — BP 129/79 | HR 56 | Ht 64.5 in | Wt 206.0 lb

## 2023-08-13 DIAGNOSIS — N809 Endometriosis, unspecified: Secondary | ICD-10-CM | POA: Diagnosis not present

## 2023-08-13 DIAGNOSIS — Z1211 Encounter for screening for malignant neoplasm of colon: Secondary | ICD-10-CM

## 2023-08-13 DIAGNOSIS — N946 Dysmenorrhea, unspecified: Secondary | ICD-10-CM

## 2023-08-13 DIAGNOSIS — Z01419 Encounter for gynecological examination (general) (routine) without abnormal findings: Secondary | ICD-10-CM | POA: Insufficient documentation

## 2023-08-13 LAB — HEMOCCULT GUIAC POC 1CARD (OFFICE): Fecal Occult Blood, POC: NEGATIVE

## 2023-08-13 MED ORDER — ORILISSA 150 MG PO TABS: 1.0000 | ORAL_TABLET | Freq: Every day | ORAL | 6 refills | Status: DC

## 2023-08-13 NOTE — Progress Notes (Signed)
 Patient ID: Sherry Flores, female   DOB: 1980/11/17, 43 y.o.   MRN: [id number] History of Present Illness: Sherry Flores is a 43 year old white female, married, G3P3003, in for a well woman gyn exam and pap. She is complaining of BTB with micronor was using for dysmenorrhea has endometriosis.  PCP is Dr Reuel Boom.    Current Medications, Allergies, Past Medical History, Past Surgical History, Family History and Social History were reviewed in [employer] record.     Review of Systems: Patient denies any headaches, hearing loss,  blurred vision, shortness of breath, chest pain, abdominal pain, problems with bowel movements, urination, or intercourse. No joint pain or mood swings.  Has had fatigue, having thryoid checked with PCP again, was elevated,  See HPI for positives   Physical Exam:BP 129/79 (BP Location: Left Arm, Patient Position: Sitting, Cuff Size: Normal)   Pulse (!) 56   Ht 5' 4.5" (1.638 m)   Wt 206 lb (93.4 kg)   LMP 07/23/2023 (Approximate)   BMI 34.81 kg/m   General:  Well developed, well nourished, no acute distress Skin:  Warm and dry Neck:  Midline trachea, normal thyroid, good ROM, no lymphadenopathy Lungs; Clear to auscultation bilaterally Breast:  No dominant palpable mass, retraction, or nipple discharge Cardiovascular: Regular rate and rhythm Abdomen:  Soft, non tender, no hepatosplenomegaly Pelvic:  External genitalia is normal in appearance, no lesions.  The vagina is normal in appearance. Urethra has no lesions or masses. The cervix is bulbous. Pap with HR HPV genotyping performed. Uterus is felt to be normal size, shape, and contour.  No adnexal masses or tenderness noted.Bladder is non tender, no masses felt. Rectal: Good sphincter tone, no polyps, + hemorrhoids felt.  Hemoccult negative. Extremities/musculoskeletal:  No swelling or varicosities noted, no clubbing or cyanosis Psych:  No mood changes, alert and cooperative,seems happy AA  is 1 Fall risk is low    08/13/2023   10:30 AM 08/07/2022    8:36 AM 05/08/2022    3:19 PM  Depression screen PHQ 2/9  Decreased Interest 0 0 0  Down, Depressed, Hopeless 0 0 0  PHQ - 2 Score 0 0 0  Altered sleeping 2 1   Tired, decreased energy 2 1   Change in appetite 0 1   Feeling bad or failure about yourself  0 0   Trouble concentrating 0 0   Moving slowly or fidgety/restless 0 0   Suicidal thoughts 0 0   PHQ-9 Score 4 3        08/13/2023   10:31 AM 08/07/2022    8:36 AM 06/06/2021    1:43 PM 06/03/2020    9:41 AM  GAD 7 : Generalized Anxiety Score  Nervous, Anxious, on Edge 0 0 0 1  Control/stop worrying 0 0 0 0  Worry too much - different things 0 1 0 1  Trouble relaxing 1 1 0 1  Restless 0 0 0 0  Easily annoyed or irritable 1 1 0 0  Afraid - awful might happen 0 0 0 0  Total GAD 7 Score 2 3 0 3      Upstream - 08/13/23 1036       Pregnancy Intention Screening   Does the patient want to become pregnant in the next year? No    Does the patient's partner want to become pregnant in the next year? No    Would the patient like to discuss contraceptive options today? Yes  Contraception Wrap Up   Current Method Female Sterilization;Oral Contraceptive    End Method Female Sterilization    Contraception Counseling Provided Yes             Examination chaperoned by Malachy Mood LPN   Impression and plan: 1. Encounter for gynecological examination with Papanicolaou smear of cervix (Primary) Pap sent Pap in 3 years if normal Physical in 1 year Labs with PCP Get mammogram in near future  - Cytology - PAP( [facility])  2. Encounter for screening fecal occult blood testing Hemoccult was negative  - POCT occult blood stool  3. Dysmenorrhea   4. Endometriosis Will rx orilissa 150 mg 1 daily, had used in past, greater than a year ago and it helped  Discussed with Dr Despina Hidden  Meds ordered this encounter  Medications   Elagolix Sodium (ORILISSA) 150 MG  TABS    Sig: Take 1 tablet (150 mg total) by mouth daily.    Dispense:  30 tablet    Refill:  6    Supervising Provider:   Duane Lope H [[id number]]    Follow up in 3 months for ROS

## 2023-08-15 ENCOUNTER — Other Ambulatory Visit: Payer: Self-pay | Admitting: Adult Health

## 2023-08-15 LAB — CYTOLOGY - PAP
Adequacy: ABSENT
Comment: NEGATIVE
Diagnosis: NEGATIVE
High risk HPV: NEGATIVE

## 2023-08-15 MED ORDER — METRONIDAZOLE 500 MG PO TABS: 500.0000 mg | ORAL_TABLET | Freq: Two times a day (BID) | ORAL | 0 refills | Status: DC

## 2023-11-03 ENCOUNTER — Other Ambulatory Visit: Payer: Self-pay | Admitting: Adult Health

## 2023-11-11 ENCOUNTER — Ambulatory Visit: Payer: Commercial Managed Care - PPO | Admitting: Adult Health

## 2023-11-11 ENCOUNTER — Encounter: Payer: Self-pay | Admitting: Adult Health

## 2023-11-11 VITALS — BP 115/77 | HR 50 | Ht 64.0 in | Wt 194.0 lb

## 2023-11-11 DIAGNOSIS — N809 Endometriosis, unspecified: Secondary | ICD-10-CM | POA: Diagnosis not present

## 2023-11-11 DIAGNOSIS — N946 Dysmenorrhea, unspecified: Secondary | ICD-10-CM

## 2023-11-11 NOTE — Progress Notes (Signed)
  Subjective:     Patient ID: Sherry Flores, female   DOB: 1980/12/20, 43 y.o.   MRN: [id number]  HPI Sherry Flores is a 43 year old white female, married, [id number], back in follow up on starting Orlissa in February feels good. Much better that on Micronor . No pain or periods.      Component Value Date/Time   DIAGPAP  08/13/2023 1039    - Negative for intraepithelial lesion or malignancy (NILM)   DIAGPAP  06/03/2020 0936    - Negative for intraepithelial lesion or malignancy (NILM)   DIAGPAP  06/10/2017 0000    NEGATIVE FOR INTRAEPITHELIAL LESIONS OR MALIGNANCY.   HPVHIGH Negative 08/13/2023 1039   HPVHIGH Negative 06/03/2020 0936   ADEQPAP  08/13/2023 1039    Satisfactory for evaluation; transformation zone component ABSENT.   ADEQPAP  06/03/2020 0936    Satisfactory for evaluation; transformation zone component ABSENT.   ADEQPAP  06/10/2017 0000    Satisfactory for evaluation  endocervical/transformation zone component ABSENT.    PCP is Dr Bearl Limes  Review of Systems Feels better with [name], no pain with sex  Mild cramps when period due, no periods Reviewed past medical,surgical, social and family history. Reviewed medications and allergies.     Objective:   Physical Exam BP 115/77 (BP Location: Left Arm, Patient Position: Sitting, Cuff Size: Normal)   Pulse (!) 50   Ht 5\' 4"  (1.626 m)   Wt 194 lb (88 kg)   BMI 33.30 kg/m     Skin warm and dry.  Lungs: clear to ausculation bilaterally. Cardiovascular: regular rate and rhythm.  Fall risk is low  Upstream - 11/11/23 1012       Pregnancy Intention Screening   Does the patient want to become pregnant in the next year? No    Does the patient's partner want to become pregnant in the next year? No    Would the patient like to discuss contraceptive options today? No      Contraception Wrap Up   Current Method Female Sterilization    End Method Female Sterilization    Contraception Counseling Provided No              Assessment:    1. Endometriosis (Primary) Feels much better with Orlissa, no pain with sex  Mild cramps when period due but no period Will continue Orlissa 150 mg 1 daily  has refills  Can use up to 24 months   2. Dysmenorrhea No period, with Orlissa, mild cramps at times     Plan:     Follow up in 3 months or sooner if needed

## 2023-12-24 ENCOUNTER — Telehealth: Payer: Self-pay | Admitting: Adult Health

## 2023-12-24 ENCOUNTER — Other Ambulatory Visit: Payer: Self-pay | Admitting: Adult Health

## 2023-12-24 MED ORDER — NORETHINDRONE 0.35 MG PO TABS
1.0000 | ORAL_TABLET | Freq: Every day | ORAL | 11 refills | Status: DC
Start: 2023-12-24 — End: 2024-02-11

## 2023-12-24 NOTE — Telephone Encounter (Signed)
Left message to call me.

## 2023-12-24 NOTE — Telephone Encounter (Signed)
 Pt does smoke some so myfembree not an option, will try micronor  again

## 2024-02-11 ENCOUNTER — Encounter: Payer: Self-pay | Admitting: Adult Health

## 2024-02-11 ENCOUNTER — Ambulatory Visit: Admitting: Adult Health

## 2024-02-11 VITALS — BP 121/73 | HR 57 | Ht 64.0 in | Wt 187.0 lb

## 2024-02-11 DIAGNOSIS — N946 Dysmenorrhea, unspecified: Secondary | ICD-10-CM

## 2024-02-11 DIAGNOSIS — N809 Endometriosis, unspecified: Secondary | ICD-10-CM | POA: Diagnosis not present

## 2024-02-11 MED ORDER — SLYND 4 MG PO TABS: 1.0000 | ORAL_TABLET | Freq: Every day | ORAL | Status: DC

## 2024-02-11 NOTE — Progress Notes (Signed)
  Subjective:     Patient ID: Sherry Flores, female   DOB: [birth year], 43 y.o.   MRN: [id number]  HPI [name] is a 43 year old white female,separated, G3P3003 back in follow up on orlissa but stopped taking in June when insurance stopped paying for it. She had period from 01/24/24 to 02/09/24. Had cramps too.     Component Value Date/Time   DIAGPAP  08/13/2023 1039    - Negative for intraepithelial lesion or malignancy (NILM)   DIAGPAP  06/03/2020 0936    - Negative for intraepithelial lesion or malignancy (NILM)   DIAGPAP  06/10/2017 0000    NEGATIVE FOR INTRAEPITHELIAL LESIONS OR MALIGNANCY.   HPVHIGH Negative 08/13/2023 1039   HPVHIGH Negative 06/03/2020 0936   ADEQPAP  08/13/2023 1039    Satisfactory for evaluation; transformation zone component ABSENT.   ADEQPAP  06/03/2020 0936    Satisfactory for evaluation; transformation zone component ABSENT.   ADEQPAP  06/10/2017 0000    Satisfactory for evaluation  endocervical/transformation zone component ABSENT.   PCP is Dr Toribio   Review of Systems Pain with period Bleed for 18 days when had period after stopping orlissa in June Left breast is sore [place], no bruising or mass  Reviewed past medical,surgical, social and family history. Reviewed medications and allergies.     Objective:   Physical Exam BP 121/73 (BP Location: Left Arm, Patient Position: Sitting, Cuff Size: Normal)   Pulse (!) 57   Ht 5' 4 (1.626 m)   Wt 187 lb (84.8 kg)   LMP 01/24/2024 (Exact Date)   BMI 32.10 kg/m     Skin warm and dry. Lungs: clear to ausculation bilaterally. Cardiovascular: regular rate and rhythm.   Upstream - 02/11/24 0837       Pregnancy Intention Screening   Does the patient want to become pregnant in the next year? No    Does the patient's partner want to become pregnant in the next year? No    Would the patient like to discuss contraceptive options today? No      Contraception Wrap Up   Current  Method Female Sterilization;Oral Contraceptive    End Method Female Sterilization;Oral Contraceptive    Contraception Counseling Provided Yes          Assessment:     1. Endometriosis (Primary) Insurance stopped paying last dose in June But serapio works well   2. Dysmenorrhea Will try slynd , gave 4 sample packs to take 1 daily, can start today     Plan:     Follow up in 3 months

## 2024-05-13 ENCOUNTER — Encounter: Payer: Self-pay | Admitting: Adult Health

## 2024-05-13 ENCOUNTER — Ambulatory Visit: Admitting: Adult Health

## 2024-05-13 VITALS — BP 120/80 | HR 55 | Ht 64.0 in | Wt 190.5 lb

## 2024-05-13 DIAGNOSIS — N809 Endometriosis, unspecified: Secondary | ICD-10-CM

## 2024-05-13 DIAGNOSIS — N946 Dysmenorrhea, unspecified: Secondary | ICD-10-CM | POA: Diagnosis not present

## 2024-05-13 MED ORDER — SLYND 4 MG PO TABS
1.0000 | ORAL_TABLET | Freq: Every day | ORAL | 12 refills | Status: AC
Start: 2024-05-13 — End: ?

## 2024-05-13 NOTE — Progress Notes (Signed)
  Subjective:     Patient ID: Sherry Flores, female   DOB: 10-02-1980, 43 y.o.   MRN: [id number]  HPI [name] is a 43 year old white female, separated, G3P3003, back in follow up on starting Slynd  to help with dysmenorrhea related to endometriosis after insurance stopped covering [employer]. And she is feeling better, has some BTB.     Component Value Date/Time   DIAGPAP  08/13/2023 1039    - Negative for intraepithelial lesion or malignancy (NILM)   DIAGPAP  06/03/2020 0936    - Negative for intraepithelial lesion or malignancy (NILM)   DIAGPAP  06/10/2017 0000    NEGATIVE FOR INTRAEPITHELIAL LESIONS OR MALIGNANCY.   HPVHIGH Negative 08/13/2023 1039   HPVHIGH Negative 06/03/2020 0936   ADEQPAP  08/13/2023 1039    Satisfactory for evaluation; transformation zone component ABSENT.   ADEQPAP  06/03/2020 0936    Satisfactory for evaluation; transformation zone component ABSENT.   ADEQPAP  06/10/2017 0000    Satisfactory for evaluation  endocervical/transformation zone component ABSENT.   PCP is D rDaniel   Review of Systems Period pain is much better Has had some BTB No pain with sex Reviewed past medical,surgical, social and family history. Reviewed medications and allergies.     Objective:   Physical Exam BP 120/80 (BP Location: Right Arm, Patient Position: Sitting, Cuff Size: Normal)   Pulse (!) 55   Ht 5' 4 (1.626 m)   Wt 190 lb 8 oz (86.4 kg)   LMP 04/22/2024 (Approximate)   BMI 32.70 kg/m     Skin warm and dry.Lungs: clear to ausculation bilaterally. Cardiovascular: regular rate and rhythm.  Fall risk is low  Upstream - 05/13/24 0835       Pregnancy Intention Screening   Does the patient want to become pregnant in the next year? No    Does the patient's partner want to become pregnant in the next year? No    Would the patient like to discuss contraceptive options today? No      Contraception Wrap Up   Current Method Female Sterilization;Oral Contraceptive     End Method Female Sterilization;Oral Contraceptive    Contraception Counseling Provided Yes          Assessment:     1. Endometriosis (Primary) Will continue Slynd , gave 1 pack and rx sent Meds ordered this encounter  Medications   Drospirenone  (SLYND ) 4 MG TABS    Sig: Take 1 tablet (4 mg total) by mouth daily.    Dispense:  28 tablet    Refill:  12    Supervising Provider:   JAYNE MINDER H [[id number]]     2. Dysmenorrhea Much better     Plan:     Return about 08/17/24 for physical

## 2024-08-13 ENCOUNTER — Ambulatory Visit: Admitting: Adult Health
